# Patient Record
Sex: Male | Born: 1985 | Hispanic: Yes | Marital: Married | Smoking: Never smoker
Health system: Southern US, Community
[De-identification: ages and names within clinical notes are randomized; demographics above are authoritative.]

## PROBLEM LIST (undated history)

## (undated) DIAGNOSIS — I10 Essential (primary) hypertension: Secondary | ICD-10-CM

## (undated) HISTORY — PX: OTHER SURGICAL HISTORY: SHX169

---

## 2010-05-14 ENCOUNTER — Emergency Department (HOSPITAL_COMMUNITY): Admission: EM | Admit: 2010-05-14 | Discharge: 2010-05-15 | Payer: Self-pay | Admitting: Emergency Medicine

## 2010-12-24 LAB — URINE MICROSCOPIC-ADD ON

## 2010-12-24 LAB — URINALYSIS, ROUTINE W REFLEX MICROSCOPIC
Glucose, UA: NEGATIVE mg/dL
Leukocytes, UA: NEGATIVE
Nitrite: NEGATIVE
Specific Gravity, Urine: 1.031 — ABNORMAL HIGH (ref 1.005–1.030)
pH: 6 (ref 5.0–8.0)

## 2011-08-25 ENCOUNTER — Other Ambulatory Visit: Payer: Self-pay

## 2011-08-25 ENCOUNTER — Emergency Department (HOSPITAL_COMMUNITY)
Admission: EM | Admit: 2011-08-25 | Discharge: 2011-08-25 | Discharge status: Against Medical Advice | Payer: Self-pay | Attending: Emergency Medicine | Admitting: Emergency Medicine

## 2011-08-25 ENCOUNTER — Encounter: Payer: Self-pay | Admitting: Emergency Medicine

## 2011-08-25 DIAGNOSIS — R55 Syncope and collapse: Secondary | ICD-10-CM

## 2011-08-25 DIAGNOSIS — R064 Hyperventilation: Secondary | ICD-10-CM | POA: Insufficient documentation

## 2011-08-25 HISTORY — DX: Essential (primary) hypertension: I10

## 2011-08-25 LAB — CBC
Platelets: 151 10*3/uL (ref 150–400)
RBC: 4.8 MIL/uL (ref 4.22–5.81)
RDW: 12.5 % (ref 11.5–15.5)
WBC: 7.5 10*3/uL (ref 4.0–10.5)

## 2011-08-25 LAB — TROPONIN I: Troponin I: 0.6 ng/mL (ref <0.30)

## 2011-08-25 LAB — BASIC METABOLIC PANEL
CO2: 26 mEq/L (ref 19–32)
Chloride: 100 mEq/L (ref 96–112)
GFR calc Af Amer: >90 mL/min (ref >90)
Sodium: 138 mEq/L (ref 135–145)

## 2011-08-25 NOTE — ED Notes (Signed)
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 Than prior to having pacemaker placed

## 2011-08-25 NOTE — ED Notes (Signed)
Copy of ekg given to dr.

## 2011-08-25 NOTE — ED Notes (Signed)
Notified Dr Patria Mane that pt wants to leave AMA

## 2011-08-25 NOTE — ED Notes (Signed)
Pt/family express that they would like to leave d/t not being seen quickly; edp notified and will come to bedside in a few minutes, updated pt/family; pt/family still expresses that they would like to leave bc pt feels better and wants to go home

## 2011-08-25 NOTE — ED Notes (Signed)
Pt resting; family at bedside now; EKG done

## 2011-08-25 NOTE — ED Notes (Signed)
Patient left AMA; e-signature obtained.

## 2011-08-25 NOTE — ED Provider Notes (Signed)
The patient left AMA prior to my evaluation. I did not perform a hx or physical.  Lyanne Co, MD 08/25/11 [phone]

## 2011-08-25 NOTE — ED Notes (Signed)
2ndary assessment; pt reports that he was laying on the couch earlier today, and he was feeling fine, but then he went to get up to void, and when he was in the bathroom, he felt dizzy/lightheaded and felt his heart racing; pt reports that he started to have black and white vision; then he felt tingling in his bil arms; pt reports now that he is having pressure in his midsternum that radiates up to his throat and report burning in his throat similar to heartburn

## 2011-08-25 NOTE — ED Notes (Signed)
Delay explained to pt.  

## 2012-07-03 DIAGNOSIS — I10 Essential (primary) hypertension: Secondary | ICD-10-CM | POA: Insufficient documentation

## 2012-07-03 DIAGNOSIS — M7660 Achilles tendinitis, unspecified leg: Secondary | ICD-10-CM | POA: Insufficient documentation

## 2012-07-04 ENCOUNTER — Emergency Department (HOSPITAL_COMMUNITY)
Admission: EM | Admit: 2012-07-04 | Discharge: 2012-07-04 | Disposition: A | Discharge status: Home / Self Care | Payer: Self-pay | Attending: Emergency Medicine | Admitting: Emergency Medicine

## 2012-07-04 DIAGNOSIS — M7650 Patellar tendinitis, unspecified knee: Secondary | ICD-10-CM

## 2012-07-04 MED ORDER — DICLOFENAC SODIUM 75 MG PO TBEC: 75.0000 mg | DELAYED_RELEASE_TABLET | Freq: Two times a day (BID) | ORAL | Status: DC

## 2012-07-04 MED ORDER — OXYCODONE-ACETAMINOPHEN 5-325 MG PO TABS
1.0000 | ORAL_TABLET | Freq: Once | ORAL | Status: AC
  Administered 2012-07-04: 1 via ORAL
  Filled 2012-07-04: qty 1

## 2012-07-04 NOTE — ED Notes (Signed)
Pt c/o bilat leg pain. Pt works as Airline pilot and actively plays soccer.Vss.pwd

## 2012-07-04 NOTE — ED Notes (Signed)
Bilateral Leg pain x a few days. Pt c/o pain in right ankle but mostly its his left knee that is the major issue of concern. He says the pain from his knee radiates up to his thigh area. Pt also c/o pain in the left heel as well. Works as a Airline pilot. Has been managing his pain with motrin. Rates pain 8/10

## 2012-07-04 NOTE — ED Provider Notes (Signed)
History     CSN: [id number]  Arrival date & time 07/03/12  2256   First MD Initiated Contact with Patient 07/04/12 0128      Chief Complaint  Patient presents with  . 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 He says that it started and bilateral foot pain, but is now bilateral knees that are sore over the patellar region. He admits that he does not wear supportive shoes, nor stretch before playing soccer. The symptoms have been persistent for 2.5 weeks. He has not tried taking any medications. He denies systemic symptoms of N/V/F/D, weakness, chills. He is able to ambulate without limp. NAD/VSS   Past Medical History  Diagnosis Date  . Hypertension     Past Surgical History  Procedure Date  . Pacemaker insertion and removal     No family history on file.  History  Substance Use Topics  . Smoking status: Never Smoker   . Smokeless tobacco: Never Used  . Alcohol Use: Yes     occasionally      Review of Systems   Review of Systems  Gen: no weight loss, fevers, chills, night sweats  Eyes: no discharge or drainage, no occular pain or visual changes  Nose: no epistaxis or rhinorrhea  Mouth: no dental pain, no sore throat  Neck: no neck pain  Lungs:No wheezing, coughing or hemoptysis CV: no chest pain, palpitations, dependent edema or orthopnea  Abd: no abdominal pain, nausea, vomiting  GU: no dysuria or gross hematuria  MSK:  Bilateral knee pain  Neuro: no headache, no focal neurologic deficits  Skin: no abnormalities Psyche: negative.    Allergies  Hydrocodone  Home Medications   Current Outpatient Rx  Name Route Sig Dispense Refill  . AMLODIPINE BESYLATE 10 MG PO TABS Oral Take 10 mg by mouth daily.    . IBUPROFEN 200 MG PO TABS Oral Take 600 mg by mouth every 6 (six) hours as  needed.    Marland Kitchen DICLOFENAC SODIUM 75 MG PO TBEC Oral Take 1 tablet (75 mg total) by mouth 2 (two) times daily. 28 tablet 0    BP 117/68  Pulse 53  Temp 98.7 F (37.1 C) (Oral)  Resp 18  Ht 5\' 7"  (1.702 m)  Wt 155 lb (70.308 kg)  BMI 24.28 kg/m2  SpO2 100%  Physical Exam  Nursing note and vitals reviewed. Constitutional: He appears well-developed and well-nourished. No distress.  HENT:  Head: Normocephalic and atraumatic.  Eyes: Pupils are equal, round, and reactive to light.  Neck: Normal range of motion. Neck supple.  Cardiovascular: Normal rate and regular rhythm.   Pulmonary/Chest: Effort normal.  Musculoskeletal:       Right knee: He exhibits normal range of motion, no swelling, no effusion, no ecchymosis, no deformity, no laceration, no erythema, normal alignment, no LCL laxity, normal patellar mobility, no bony tenderness, normal meniscus and no MCL laxity. tenderness (bilateral petaller tenderness without erythema or induration.) found. Patellar tendon tenderness noted.  Neurological: He is alert.  Skin: Skin is warm and dry.    ED Course  Procedures (including critical care time)  Labs Reviewed - No data to display No results found.   1. Patellar tendinitis       MDM  i suspect over use injuries to joints. HE is young and healthy. NO real abnormality on physical exam aside from  tenderness to palpation of patellar tendons, which is mild.  Written him for Voltaren, i suggest he try Dr. Jari Sportsman show inserts for when he plans to be on his feet for prolonged period of time. Also, stretching and icing recommended.  Referral to Ortho given and PCP.  Pt has been advised of the symptoms that warrant their return to the ED. Patient has voiced understanding and has agreed to follow-up with the PCP or specialist.         Dorthula Matas, PA 07/04/12 [phone]

## 2012-07-05 NOTE — ED Provider Notes (Signed)
Medical screening examination/treatment/procedure(s) were performed by non-physician practitioner and as supervising physician I was immediately available for consultation/collaboration.  Jonathandavid Marlett, MD 07/05/12 0024 

## 2020-05-18 ENCOUNTER — Emergency Department: Payer: Self-pay

## 2020-05-18 ENCOUNTER — Other Ambulatory Visit: Payer: Self-pay

## 2020-05-18 ENCOUNTER — Encounter: Payer: Self-pay | Admitting: Emergency Medicine

## 2020-05-18 ENCOUNTER — Emergency Department
Admission: EM | Admit: 2020-05-18 | Discharge: 2020-05-18 | Disposition: A | Discharge status: Home / Self Care | Payer: Self-pay | Attending: Emergency Medicine | Admitting: Emergency Medicine

## 2020-05-18 DIAGNOSIS — Z20822 Contact with and (suspected) exposure to covid-19: Secondary | ICD-10-CM | POA: Insufficient documentation

## 2020-05-18 DIAGNOSIS — Z950 Presence of cardiac pacemaker: Secondary | ICD-10-CM | POA: Insufficient documentation

## 2020-05-18 DIAGNOSIS — I10 Essential (primary) hypertension: Secondary | ICD-10-CM | POA: Insufficient documentation

## 2020-05-18 DIAGNOSIS — R079 Chest pain, unspecified: Secondary | ICD-10-CM | POA: Insufficient documentation

## 2020-05-18 LAB — BASIC METABOLIC PANEL
Anion gap: 7 (ref 5–15)
BUN: 13 mg/dL (ref 6–20)
CO2: 29 mmol/L (ref 22–32)
Calcium: 8.9 mg/dL (ref 8.9–10.3)
Chloride: 104 mmol/L (ref 98–111)
Creatinine, Ser: 0.88 mg/dL (ref 0.61–1.24)
GFR calc Af Amer: >60 mL/min (ref >60)
GFR calc non Af Amer: >60 mL/min (ref >60)
Glucose, Bld: 112 mg/dL — ABNORMAL HIGH (ref 70–99)
Potassium: 4 mmol/L (ref 3.5–5.1)
Sodium: 140 mmol/L (ref 135–145)

## 2020-05-18 LAB — CBC
HCT: 41.7 % (ref 39.0–52.0)
Hemoglobin: 15 g/dL (ref 13.0–17.0)
MCH: 32.1 pg (ref 26.0–34.0)
MCHC: 36 g/dL (ref 30.0–36.0)
MCV: 89.1 fL (ref 80.0–100.0)
Platelets: 182 10*3/uL (ref 150–400)
RBC: 4.68 MIL/uL (ref 4.22–5.81)
RDW: 12.8 % (ref 11.5–15.5)
WBC: 3.7 10*3/uL — ABNORMAL LOW (ref 4.0–10.5)
nRBC: 0 % (ref 0.0–0.2)

## 2020-05-18 LAB — SARS CORONAVIRUS 2 BY RT PCR (HOSPITAL ORDER, PERFORMED IN ~~LOC~~ HOSPITAL LAB): SARS Coronavirus 2: NEGATIVE

## 2020-05-18 LAB — TROPONIN I (HIGH SENSITIVITY)
Troponin I (High Sensitivity): 7 ng/L (ref <18)
Troponin I (High Sensitivity): 5 ng/L (ref <18)

## 2020-05-18 MED ORDER — NITROGLYCERIN 0.4 MG SL SUBL
0.4000 mg | SUBLINGUAL_TABLET | SUBLINGUAL | Status: AC
  Administered 2020-05-18: 0.4 mg via SUBLINGUAL
  Filled 2020-05-18: qty 1

## 2020-05-18 MED ORDER — IOHEXOL 350 MG/ML SOLN
75.0000 mL | Freq: Once | INTRAVENOUS | Status: AC | PRN
  Administered 2020-05-18: 75 mL via INTRAVENOUS

## 2020-05-18 MED ORDER — ASPIRIN 81 MG PO CHEW
324.0000 mg | CHEWABLE_TABLET | Freq: Once | ORAL | Status: AC
  Administered 2020-05-18: 324 mg via ORAL
  Filled 2020-05-18: qty 4

## 2020-05-18 NOTE — ED Triage Notes (Addendum)
Patient presents to the ED with chest pain radiating down his left arm x 1 week.  Patient states today the chest pain feels worse.  Patient describes the pain as tightness, more on the left side.  Movement does not increase pain.  Patient also reports a pre-syncopal episode a couple of days ago.  Patient had a pacemaker in the past but states it was taken out approx. 6 years ago due to infection.

## 2020-05-18 NOTE — ED Provider Notes (Signed)
[facility] Emergency Department Provider Note   ____________________________________________   None    (approximate)  I have reviewed the triage vital signs and the nursing notes.   HISTORY  Chief Complaint Chest Pain    HPI A 34 year old patient with a history of hypertension presents for evaluation of chest pain. Initial onset of pain was more than 6 hours ago. The patient's chest pain is described as heaviness/pressure/tightness and is not worse with exertion. The patient reports some diaphoresis. The patient's chest pain is middle- or left-sided, is not well-localized, is not sharp and does radiate to the arms/jaw/neck. The patient does not complain of nausea. The patient has a family history of coronary artery disease in a first-degree relative with onset less than age 35. The patient has no history of stroke, has no history of peripheral artery disease, has not smoked in the past 90 days, denies any history of treated diabetes, has no history of hypercholesterolemia and does not have an elevated BMI (>=30).   Patient has been having chest pain for about 1 week.  Reports this is a recurring issue it is located in his left upper chest.  He is previously had a pacemaker but actually had it removed about 6 years ago by his cardiologist.  Not sure why.  She still has 1 wire left.  He has not had any skipped beats or palpitations.  No fevers or chills.  No cough.  No shortness of breath.  No leg swelling.  Pain he reports is mild to moderate located in the left upper chest and persistent for about a week now.   Past Medical History:  Diagnosis Date   Hypertension     There are no problems to display for this patient.   Past Surgical History:  Procedure Laterality Date   pacemaker insertion and removal      Prior to Admission medications   Medication Sig Start Date End Date Taking? Authorizing Provider  ibuprofen (ADVIL,MOTRIN) 200 MG tablet Take  600 mg by mouth every 6 (six) hours as needed.   Yes [provider]    Allergies Hydrocodone  No family history on file.  Social History Social History   Tobacco Use   Smoking status: Never Smoker   Smokeless tobacco: Never Used  Substance Use Topics   Alcohol use: Yes    Comment: occasionally   Drug use: No    Review of Systems Constitutional: No fever/chills Eyes: No visual changes. ENT: No sore throat. Cardiovascular: See HPI  respiratory: Denies shortness of breath. Gastrointestinal: No abdominal pain.   Genitourinary: Negative for dysuria. Musculoskeletal: Negative for back pain. Skin: Negative for rash. Neurological: Negative for headaches, areas of focal weakness or numbness.    ____________________________________________   PHYSICAL EXAM:  VITAL SIGNS: ED Triage Vitals  Enc Vitals Group     BP 05/18/20 0905 116/73     Pulse Rate 05/18/20 0905 63     Resp 05/18/20 0905 17     Temp 05/18/20 0905 98 F (36.7 C)     Temp Source 05/18/20 0905 Oral     SpO2 05/18/20 0905 100 %     Weight 05/18/20 0920 165 lb (74.8 kg)     Height 05/18/20 0920 5\' 5"  (1.651 m)     Head Circumference --      Peak Flow --      Pain Score --      Pain Loc --      Pain Edu? --  Excl. in GC? --     Constitutional: Alert and oriented. Well appearing and in no acute distress. Eyes: Conjunctivae are normal. Head: Atraumatic. Nose: No congestion/rhinnorhea. Mouth/Throat: Mucous membranes are moist. Neck: No stridor.  Cardiovascular: Normal rate, regular rhythm. Grossly normal heart sounds.  Good peripheral circulation.  Patient has palpable lead below the subcutaneous tissue left upper chest, there is no drainage redness or surrounding erythema but he does report the area to have a tender discomfort to it.  He also tells me this is something has happened him several times in the past seems to go might come and go at times not really sure why but he experienced  the symptoms several times Respiratory: Normal respiratory effort.  No retractions. Lungs CTAB. Gastrointestinal: Soft and nontender. No distention. Musculoskeletal: No lower extremity tenderness nor edema. Neurologic:  Normal speech and language. No gross focal neurologic deficits are appreciated.  Skin:  Skin is warm, dry and intact. No rash noted. Psychiatric: Mood and affect are normal. Speech and behavior are normal.  ____________________________________________   LABS (all labs ordered are listed, but only abnormal results are displayed)  Labs Reviewed  BASIC METABOLIC PANEL - Abnormal; Notable for the following components:      Result Value   Glucose, Bld 112 (*)    All other components within normal limits  CBC - Abnormal; Notable for the following components:   WBC 3.7 (*)    All other components within normal limits  SARS CORONAVIRUS 2 BY RT PCR (HOSPITAL ORDER, PERFORMED IN [facility])  TROPONIN I (HIGH SENSITIVITY)  TROPONIN I (HIGH SENSITIVITY)   ____________________________________________  EKG  Triage EKG reviewed by me at 6 PM, Initial EKG performed at 9:10 AM Heart rate 55 QRS 100 QTc 400 Sinus bradycardia, incomplete right bundle branch block.  Notable ST abnormality including ST elevation in V2 and biphasic appearance V3 as well as J-point type elevation in V4.  Multiple T wave abnormalities noted.  Is may be indicative of early repolarization abnormality  Compared with previous EKG from October 18, 2011, appears similar in morphology with regard to V2, but V3 demonstrates biphasic appearance now ____________________________________________  RADIOLOGY  No results found.   Imaging review studies reviewed, negative for acute findings in the chest ____________________________________________   PROCEDURES  Procedure(s) performed: None  Procedures  Critical Care performed: No  ____________________________________________   INITIAL  IMPRESSION / ASSESSMENT AND PLAN / ED COURSE  Pertinent labs & imaging results that were available during my care of the patient were reviewed by me and considered in my medical decision making (see chart for details).   Differential diagnosis includes, but is not limited to, ACS, aortic dissection, pulmonary embolism, cardiac tamponade, pneumothorax, pneumonia, pericarditis, myocarditis, GI-related causes including esophagitis/gastritis, and musculoskeletal chest wall pain.     Clinical Course as of May 20 1340  Mon May 18, 2020  2152 Case, history, EKGs all reviewed with Dr. Irena Cords of cardiology.    [MQ]    Clinical Course User Index [MQ] Sharyn Creamer, MD   HEAR Score: 4    ----------------------------------------- 10:11 PM on 05/18/2020 -----------------------------------------  Patient is doing well resting comfortably.  Denies any ongoing symptoms except for light discomfort in the left upper chest that seem that has been experiencing for about a week now.  His work-up very reassuring I discussed his EKGs and viewed them with [facility] who advises close outpatient follow-up, discussed with the both the patient and his family at the  bedside all in agreement.  Royalty Domagala was evaluated in Emergency Department on 05/20/2020 for the symptoms described in the history of present illness. He was evaluated in the context of the global COVID-19 pandemic, which necessitated consideration that the patient might be at risk for infection with the SARS-CoV-2 virus that causes COVID-19. Institutional protocols and algorithms that pertain to the evaluation of patients at risk for COVID-19 are in a state of rapid change based on information released by regulatory bodies including the CDC and federal and state organizations. These policies and algorithms were followed during the patient's care in the ED.  negative Covid  ____________________________________________   FINAL CLINICAL  IMPRESSION(S) / ED DIAGNOSES  Final diagnoses:  Chest pain, moderate coronary artery risk        Note:  This document was prepared using Dragon voice recognition software and may include unintentional dictation errors       Sharyn Creamer, MD 05/20/20 1341

## 2020-05-18 NOTE — Discharge Instructions (Signed)

## 2020-06-03 ENCOUNTER — Observation Stay
Admission: EM | Admit: 2020-06-03 | Discharge: 2020-06-04 | Disposition: A | Discharge status: Home / Self Care | Payer: Self-pay | Attending: Internal Medicine | Admitting: Internal Medicine

## 2020-06-03 ENCOUNTER — Encounter: Payer: Self-pay | Admitting: Emergency Medicine

## 2020-06-03 ENCOUNTER — Emergency Department: Payer: Self-pay

## 2020-06-03 ENCOUNTER — Other Ambulatory Visit: Payer: Self-pay

## 2020-06-03 ENCOUNTER — Encounter
Admission: EM | Disposition: A | Discharge status: Home / Self Care | Payer: Self-pay | Source: Home / Self Care | Attending: Emergency Medicine

## 2020-06-03 DIAGNOSIS — I249 Acute ischemic heart disease, unspecified: Principal | ICD-10-CM | POA: Insufficient documentation

## 2020-06-03 DIAGNOSIS — R739 Hyperglycemia, unspecified: Secondary | ICD-10-CM | POA: Insufficient documentation

## 2020-06-03 DIAGNOSIS — R0789 Other chest pain: Secondary | ICD-10-CM | POA: Diagnosis present

## 2020-06-03 DIAGNOSIS — R079 Chest pain, unspecified: Secondary | ICD-10-CM

## 2020-06-03 DIAGNOSIS — Z20822 Contact with and (suspected) exposure to covid-19: Secondary | ICD-10-CM | POA: Insufficient documentation

## 2020-06-03 DIAGNOSIS — I10 Essential (primary) hypertension: Secondary | ICD-10-CM | POA: Insufficient documentation

## 2020-06-03 LAB — CBC WITH DIFFERENTIAL/PLATELET
Abs Immature Granulocytes: 0.01 10*3/uL (ref 0.00–0.07)
Basophils Absolute: 0 10*3/uL (ref 0.0–0.1)
Basophils Relative: 1 %
Eosinophils Absolute: 0.1 10*3/uL (ref 0.0–0.5)
Eosinophils Relative: 2 %
HCT: 40.6 % (ref 39.0–52.0)
Hemoglobin: 14.2 g/dL (ref 13.0–17.0)
Immature Granulocytes: 0 %
Lymphocytes Relative: 31 %
Lymphs Abs: 1.4 10*3/uL (ref 0.7–4.0)
MCH: 31.8 pg (ref 26.0–34.0)
MCHC: 35 g/dL (ref 30.0–36.0)
MCV: 91 fL (ref 80.0–100.0)
Monocytes Absolute: 0.3 10*3/uL (ref 0.1–1.0)
Monocytes Relative: 7 %
Neutro Abs: 2.6 10*3/uL (ref 1.7–7.7)
Neutrophils Relative %: 59 %
Platelets: 172 10*3/uL (ref 150–400)
RBC: 4.46 MIL/uL (ref 4.22–5.81)
RDW: 12.7 % (ref 11.5–15.5)
WBC: 4.4 10*3/uL (ref 4.0–10.5)
nRBC: 0 % (ref 0.0–0.2)

## 2020-06-03 LAB — APTT: aPTT: 32 seconds (ref 24–36)

## 2020-06-03 LAB — PROTIME-INR
INR: 1.1 (ref 0.8–1.2)
Prothrombin Time: 13.3 seconds (ref 11.4–15.2)

## 2020-06-03 LAB — COMPREHENSIVE METABOLIC PANEL
ALT: 20 U/L (ref 0–44)
AST: 27 U/L (ref 15–41)
Albumin: 4.3 g/dL (ref 3.5–5.0)
Alkaline Phosphatase: 80 U/L (ref 38–126)
Anion gap: 10 (ref 5–15)
BUN: 21 mg/dL — ABNORMAL HIGH (ref 6–20)
CO2: 25 mmol/L (ref 22–32)
Calcium: 8.7 mg/dL — ABNORMAL LOW (ref 8.9–10.3)
Chloride: 102 mmol/L (ref 98–111)
Creatinine, Ser: 0.89 mg/dL (ref 0.61–1.24)
GFR calc Af Amer: >60 mL/min (ref >60)
GFR calc non Af Amer: >60 mL/min (ref >60)
Glucose, Bld: 144 mg/dL — ABNORMAL HIGH (ref 70–99)
Potassium: 3.7 mmol/L (ref 3.5–5.1)
Sodium: 137 mmol/L (ref 135–145)
Total Bilirubin: 1 mg/dL (ref 0.3–1.2)
Total Protein: 7.2 g/dL (ref 6.5–8.1)

## 2020-06-03 LAB — LIPID PANEL
Cholesterol: 174 mg/dL (ref 0–200)
HDL: 48 mg/dL (ref >40)
LDL Cholesterol: 103 mg/dL — ABNORMAL HIGH (ref 0–99)
Total CHOL/HDL Ratio: 3.6 RATIO
Triglycerides: 115 mg/dL (ref <150)
VLDL: 23 mg/dL (ref 0–40)

## 2020-06-03 LAB — TROPONIN I (HIGH SENSITIVITY)
Troponin I (High Sensitivity): 5 ng/L (ref <18)
Troponin I (High Sensitivity): 3 ng/L (ref <18)

## 2020-06-03 LAB — SARS CORONAVIRUS 2 BY RT PCR (HOSPITAL ORDER, PERFORMED IN ~~LOC~~ HOSPITAL LAB): SARS Coronavirus 2: NEGATIVE

## 2020-06-03 SURGERY — CORONARY/GRAFT ACUTE MI REVASCULARIZATION
Anesthesia: Moderate Sedation

## 2020-06-03 MED ORDER — SODIUM CHLORIDE 0.9 % IV SOLN: INTRAVENOUS | Status: DC

## 2020-06-03 MED ORDER — HEPARIN BOLUS VIA INFUSION
4000.0000 [IU] | Freq: Once | INTRAVENOUS | Status: DC
  Filled 2020-06-03: qty 4000

## 2020-06-03 MED ORDER — ATORVASTATIN CALCIUM 80 MG PO TABS
80.0000 mg | ORAL_TABLET | Freq: Every day | ORAL | Status: DC
  Administered 2020-06-03: 80 mg via ORAL
  Filled 2020-06-03: qty 4

## 2020-06-03 MED ORDER — HEPARIN SODIUM (PORCINE) 5000 UNIT/ML IJ SOLN
4000.0000 [IU] | Freq: Once | INTRAMUSCULAR | Status: AC
  Administered 2020-06-03: 4000 [IU] via INTRAVENOUS

## 2020-06-03 MED ORDER — FENTANYL CITRATE (PF) 100 MCG/2ML IJ SOLN
50.0000 ug | Freq: Once | INTRAMUSCULAR | Status: AC
  Administered 2020-06-03: 50 ug via INTRAVENOUS
  Filled 2020-06-03: qty 2

## 2020-06-03 MED ORDER — ASPIRIN EC 325 MG PO TBEC: 325.0000 mg | DELAYED_RELEASE_TABLET | Freq: Every day | ORAL | Status: DC

## 2020-06-03 MED ORDER — ASPIRIN 81 MG PO CHEW
324.0000 mg | CHEWABLE_TABLET | Freq: Once | ORAL | Status: AC
  Administered 2020-06-03: 324 mg via ORAL

## 2020-06-03 MED ORDER — ALPRAZOLAM 0.25 MG PO TABS: 0.2500 mg | ORAL_TABLET | Freq: Two times a day (BID) | ORAL | Status: DC | PRN

## 2020-06-03 MED ORDER — HEPARIN (PORCINE) 25000 UT/250ML-% IV SOLN: 850.0000 [IU]/h | INTRAVENOUS | Status: DC

## 2020-06-03 MED ORDER — ACETAMINOPHEN 325 MG PO TABS: 650.0000 mg | ORAL_TABLET | ORAL | Status: DC | PRN

## 2020-06-03 MED ORDER — ONDANSETRON HCL 4 MG/2ML IJ SOLN: 4.0000 mg | Freq: Four times a day (QID) | INTRAMUSCULAR | Status: DC | PRN

## 2020-06-03 MED ORDER — ZOLPIDEM TARTRATE 5 MG PO TABS: 5.0000 mg | ORAL_TABLET | Freq: Every evening | ORAL | Status: DC | PRN

## 2020-06-03 NOTE — ED Notes (Signed)
Covid swab sent to lab.

## 2020-06-03 NOTE — ED Notes (Signed)
324mg  aspirin chewable given at this time

## 2020-06-03 NOTE — ED Notes (Signed)
Dr. Cassie Freer at bedside at this time

## 2020-06-03 NOTE — Consult Note (Signed)
ANTICOAGULATION CONSULT NOTE - Initial Consult  Pharmacy Consult for Heparin Indication: chest pain/ACS  Allergies  Allergen Reactions  . Hydrocodone Swelling    Swelling to face    Patient Measurements: Height: 5\' 5"  (165.1 cm) Weight: 74.8 kg (165 lb) IBW/kg (Calculated) : 61.5 Heparin Dosing Weight: 74.8 kg  Vital Signs: Temp: 98.1 F (36.7 C) (08/25 1742) Temp Source: Oral (08/25 1742) BP: 105/74 (08/25 1753) Pulse Rate: 68 (08/25 1753)  Labs: Recent Labs    06/03/20 1747  HGB 14.2  HCT 40.6  PLT 172  APTT 32  LABPROT 13.3  INR 1.1    Estimated Creatinine Clearance: 109.6 mL/min (by C-G formula based on SCr of 0.88 mg/dL).   Medical History: Past Medical History:  Diagnosis Date  . Hypertension     Medications:  (Not in a hospital admission)  Scheduled:   Infusions:  . sodium chloride     PRN:  Anti-infectives (From admission, onward)   None      Assessment: Pharmacy consulted to start heparin for ACS. No DOAC PTA noted.   Goal of Therapy:  Heparin level 0.3-0.7 units/ml Monitor platelets by anticoagulation protocol: Yes   Plan:  Give 4000 units bolus x 1 Start heparin infusion at 850 units/hr Check anti-Xa level in 6 hours and daily while on heparin Continue to monitor H&H and platelets  06/05/20, PharmD, BCPS 06/03/2020,6:08 PM

## 2020-06-03 NOTE — ED Notes (Signed)
Dr Goodman at bedside with pt. 

## 2020-06-03 NOTE — ED Notes (Signed)
Repeat EKG done at this time.

## 2020-06-03 NOTE — ED Provider Notes (Signed)
[facility] Emergency Department Provider Note  ____________________________________________   I have reviewed the triage vital signs and the nursing notes.   HISTORY  Chief Complaint Chest Pain   History limited by: Not Limited   HPI Ricky Green is a 34 y.o. male who presents to the emergency department today because of concern for chest pain. Located in his left chest it started this morning. The patient says that it feels like a pressure. Is accompanied by shortness of breath and some sweating. Patient had similar pain a couple of weeks ago and says he was evaluated in the emergency department. The patient denies any fevers. Does have history of syncope in the past and had a pacemaker placed roughly 5-6 years ago. The patient had it removed because of concern for infection.    Records reviewed. Per medical record review patient has a history of HTN.  Past Medical History:  Diagnosis Date  . Hypertension     There are no problems to display for this patient.   Past Surgical History:  Procedure Laterality Date  . pacemaker insertion and removal      Prior to Admission medications   Medication Sig Start Date End Date Taking? Authorizing Provider  ibuprofen (ADVIL,MOTRIN) 200 MG tablet Take 600 mg by mouth every 6 (six) hours as needed.    [provider]    Allergies Hydrocodone  History reviewed. No pertinent family history.  Social History Social History   Tobacco Use  . Smoking status: Never Smoker  . Smokeless tobacco: Never Used  Substance Use Topics  . Alcohol use: Yes    Comment: occasionally  . Drug use: No    Review of Systems Constitutional: No fever/chills Eyes: No visual changes. ENT: No sore throat. Cardiovascular: Positive for chest pain. Respiratory: Positive for shortness of breath. Gastrointestinal: No abdominal pain.  No nausea, no vomiting.  No diarrhea.   Genitourinary: Negative for  dysuria. Musculoskeletal: Negative for back pain. Skin: Negative for rash. Neurological: Negative for headaches, focal weakness or numbness.  ____________________________________________   PHYSICAL EXAM:  VITAL SIGNS: ED Triage Vitals [06/03/20 1742]  Enc Vitals Group     BP 120/64     Pulse Rate 65     Resp 20     Temp 98.1 F (36.7 C)     Temp Source Oral     SpO2 99 %     Weight 165 lb (74.8 kg)     Height 5\' 5"  (1.651 m)     Head Circumference      Peak Flow      Pain Score 7   Constitutional: Alert and oriented.  Eyes: Conjunctivae are normal.  ENT      Head: Normocephalic and atraumatic.      Nose: No congestion/rhinnorhea.      Mouth/Throat: Mucous membranes are moist.      Neck: No stridor. Hematological/Lymphatic/Immunilogical: No cervical lymphadenopathy. Cardiovascular: Normal rate, regular rhythm.  No murmurs, rubs, or gallops.  Respiratory: Normal respiratory effort without tachypnea nor retractions. Breath sounds are clear and equal bilaterally. No wheezes/rales/rhonchi. Gastrointestinal: Soft and non tender. No rebound. No guarding.  Genitourinary: Deferred Musculoskeletal: Normal range of motion in all extremities. No lower extremity edema. Neurologic:  Normal speech and language. No gross focal neurologic deficits are appreciated.  Skin:  Skin is warm, dry and intact. No rash noted. Psychiatric: Mood and affect are normal. Speech and behavior are normal. Patient exhibits appropriate insight and judgment.  ____________________________________________  LABS (pertinent positives/negatives)  Trop hs 5 CMP wnl except glu 144, BUN 21, ca 8.8 CBC wbc 4.4, hgb 14.2, plt 172 ____________________________________________   EKG  I, Phineas Semen, attending physician, personally viewed and interpreted this EKG  EKG Time: 1729 Rate: 67 Rhythm: normal sinus rhythm Axis: normal Intervals: qtc 424 QRS: narrow, q waves II, III, aVF ST changes: st  elevation 1 avl, v2, st depression II, III, aVF, V6 Impression: abnormal ekg   ____________________________________________    RADIOLOGY  CXR No acute abnormality  ____________________________________________   PROCEDURES  Procedures  ____________________________________________   INITIAL IMPRESSION / ASSESSMENT AND PLAN / ED COURSE  Pertinent labs & imaging results that were available during my care of the patient were reviewed by me and considered in my medical decision making (see chart for details).   Patient presented to the emergency department today because of concerns for chest pain.  EKG was concerning for possible ST elevation as well as some ST depressions in new appearing Q waves.  Because of this code STEMI was called.  Dr. Darrold Junker with cardiology came and evaluated the patient and evaluated EKGs.  At this time he did not think EKGs represented ST elevation MI.  Code STEMI was canceled.  Did however recommend admission for stress test tomorrow and serial troponins.   ____________________________________________   FINAL CLINICAL IMPRESSION(S) / ED DIAGNOSES  Final diagnoses:  Chest pain, unspecified type     Note: This dictation was prepared with Dragon dictation. Any transcriptional errors that result from this process are unintentional     Phineas Semen, MD 06/03/20 1904

## 2020-06-03 NOTE — H&P (Signed)
[place]   PATIENT NAME: Ricky Green    MR#:  [id number]  DATE OF BIRTH:  12/22/1985  DATE OF ADMISSION:  06/03/2020  PRIMARY CARE PHYSICIAN: Patient, No Pcp Per   REQUESTING/REFERRING PHYSICIAN: Phineas Semen, MD CHIEF COMPLAINT:   Chief Complaint  Patient presents with  . Chest Pain    HISTORY OF PRESENT ILLNESS:  Ricky Green  is a 34 y.o. male with a known history of hypertension, who presented to the emergency room with acute onset of midsternal chest pain felt as a pressure and sharp pain with associated diaphoresis, dyspnea and palpitations are started this morning and has been intermittent since then.  He denied any nausea or vomiting.  No leg pain or edema recent travels or surgeries.  It has been radiating to his left arm.  He was seen on 05/18/2020 in the ER and had a couple sets of negative troponins and plans were made for cardiac stress test and event monitor.  He denied any abdominal pain or melena or rectal bleeding per rectum.  No bleeding diathesis.  Upon presentation to the emergency room, heart rate was 69 and later 56 and otherwise vital signs were within normal.  Labs revealed unremarkable CMP and high-sensitivity troponin I of 5 mL 3.  CBC was unremarkable.  Lipid profile showed an LDL of 115 with HDL of 48 total cholesterol 174 with triglyceride of 115.  COVID-19 PCR came back negative.  Portable chest ray showed no acute cardiopulmonary disease.   EKG showed normal sinus rhythm with a rate of 66 with 1 mm ST segment elevation in V2 possibly related to early repolarization and T wave inversion in V1 and V3 with flattened T waves in V5 and V6 and inferior T wave inversion with ST segment depression.  Contact was made with Dr. Darrold Junker who is aware about the patient.  The patient was given 40 aspirin, 50 mcg of IV fentanyl and IV heparin.  He will be admitted to a progressive unit bed for further evaluation and management of suspected acute coronary  syndrome. PAST MEDICAL HISTORY:   Past Medical History:  Diagnosis Date  . Hypertension     PAST SURGICAL HISTORY:   Past Surgical History:  Procedure Laterality Date  . pacemaker insertion and removal      SOCIAL HISTORY:   Social History   Tobacco Use  . Smoking status: Never Smoker  . Smokeless tobacco: Never Used  Substance Use Topics  . Alcohol use: Yes    Comment: occasionally    FAMILY HISTORY:  Positive for coronary artery disease in his grandmother.  DRUG ALLERGIES:   Allergies  Allergen Reactions  . Hydrocodone Swelling    Swelling to face    REVIEW OF SYSTEMS:   ROS As per history of present illness. All pertinent systems were reviewed above. Constitutional, HEENT, cardiovascular, respiratory, GI, GU, musculoskeletal, neuro, psychiatric, endocrine, integumentary and hematologic systems were reviewed and are otherwise negative/unremarkable except for positive findings mentioned above in the HPI.   MEDICATIONS AT HOME:   Prior to Admission medications   Medication Sig Start Date End Date Taking? Authorizing Provider  aspirin 81 MG EC tablet Take by mouth.   Yes [provider]  ibuprofen (ADVIL,MOTRIN) 200 MG tablet Take 400 mg by mouth every 6 (six) hours as needed.    Yes [provider]      VITAL SIGNS:  Blood pressure 105/74, pulse 68, temperature 98.1 F (36.7 C), temperature source  Oral, resp. rate 18, height 5\' 5"  (1.651 m), weight 74.8 kg, SpO2 100 %.  PHYSICAL EXAMINATION:  Physical Exam  GENERAL:  34 y.o.-year-old  male patient lying in the bed with no acute distress.  EYES: Pupils equal, round, reactive to light and accommodation. No scleral icterus. Extraocular muscles intact.  HEENT: Head atraumatic, normocephalic. Oropharynx and nasopharynx clear.  NECK:  Supple, no jugular venous distention. No thyroid enlargement, no tenderness.  LUNGS: Normal breath sounds bilaterally, no wheezing, rales,rhonchi or  crepitation. No use of accessory muscles of respiration.  CARDIOVASCULAR: Regular rate and rhythm, S1, S2 normal. No murmurs, rubs, or gallops.  ABDOMEN: Soft, nondistended, nontender. Bowel sounds present. No organomegaly or mass.  EXTREMITIES: No pedal edema, cyanosis, or clubbing.  NEUROLOGIC: Cranial nerves II through XII are intact. Muscle strength 5/5 in all extremities. Sensation intact. Gait not checked.  PSYCHIATRIC: The patient is alert and oriented x 3.  Normal affect and good eye contact. SKIN: No obvious rash, lesion, or ulcer.   LABORATORY PANEL:   CBC Recent Labs  Lab 06/03/20 1747  WBC 4.4  HGB 14.2  HCT 40.6  PLT 172   ------------------------------------------------------------------------------------------------------------------  Chemistries  Recent Labs  Lab 06/03/20 1747  NA 137  K 3.7  CL 102  CO2 25  GLUCOSE 144*  BUN 21*  CREATININE 0.89  CALCIUM 8.7*  AST 27  ALT 20  ALKPHOS 80  BILITOT 1.0   ------------------------------------------------------------------------------------------------------------------  Cardiac Enzymes No results for input(s): TROPONINI in the last 168 hours. ------------------------------------------------------------------------------------------------------------------  RADIOLOGY:  DG Chest Portable 1 View  Result Date: 06/03/2020 CLINICAL DATA:  Left-sided chest pain for several hours EXAM: PORTABLE CHEST 1 VIEW COMPARISON:  05/18/2020 FINDINGS: Cardiac shadow is within normal limits. The lungs are well aerated bilaterally. Old pacing lead is noted over the left chest stable from the prior study. No focal infiltrate is seen. No bony abnormality is noted. IMPRESSION: No active disease. Electronically Signed   By: 07/18/2020 M.D.   On: 06/03/2020 18:38      IMPRESSION AND PLAN:   1.  Chest pain with abnormal EKG concerning for acute coronary syndrome. -The patient will be admitted to a progressive unit bed. -We  will continue him on IV heparin. -She will be continued on aspirin and will add high-dose statin therapy. -The patient will be placed on as needed sublingual nitroglycerin and morphine sulfate for pain. -Cardiology consultation will be obtained in a.m. for further cardiac risk stratification. -Dr. 06/05/2020 was notified about the patient and may be planning on cardiac catheterization.  2.  Essential hypertension. -We will continue his amlodipine.  3.  DVT prophylaxis. -The patient will be on IV heparin.   All the records are reviewed and case discussed with ED provider. The plan of care was discussed in details with the patient (and family). I answered all questions. The patient agreed to proceed with the above mentioned plan. Further management will depend upon hospital course.   CODE STATUS: Full code  Status is: Inpatient  Remains inpatient appropriate because:Ongoing diagnostic testing needed not appropriate for outpatient work up, Unsafe d/c plan, IV treatments appropriate due to intensity of illness or inability to take PO and Inpatient level of care appropriate due to severity of illness   Dispo: The patient is from: Home              Anticipated d/c is to: Home              Anticipated d/c  date is: 2 days              Patient currently is not medically stable to d/c.   TOTAL TIME TAKING CARE OF THIS PATIENT: 55 minutes.    Hannah Beat M.D on 06/03/2020 at 7:31 PM  [facility]   From 7 PM-7 AM, contact night-coverage [email or web address]  CC: Primary care physician; Patient, No Pcp Per   Note: This dictation was prepared with Dragon dictation along with smaller phrase technology. Any transcriptional typo errors that result from this process are unintentional.

## 2020-06-03 NOTE — Consult Note (Signed)
[facility] Cardiology  CARDIOLOGY CONSULT NOTE  Patient ID: Ricky Green MRN: [id number] DOB/AGE: [birth year] 36 y.o.  Admit date: 06/03/2020 Referring Physician Derrill Kay Primary Physician Primary Cardiologist Gwen Pounds Reason for Consultation chest pain  HPI: 34 year old gentleman referred for evaluation of chest pain and possible STEMI.  The patient awoke this morning at approximately 5 AM, experienced chest pain on and off all day, finally had a ride and came to the emergency room this afternoon at approximately 5 PM.  ECG revealed sinus rhythm, 1 mm of ST elevation in aVL with T wave inversion in lead III, V3 and V4.  The patient was recently seen in [facility] ED on 06/18/2020 with similar chest pain syndrome.  At that time ECG revealed incomplete right bundle branch block, with ST elevation in lead V3 and V4 and T wave inversions in leads III and aVF.  The patient was seen as outpatient by Dr. Gwen Pounds, 7 day Holter monitor was placed, with plan for stress test in 2 weeks.  The patient describes left-sided chest discomfort, pressure-like in sensation with shooting pains to his left arm.  Initial high sensitivity troponin is 5.  Review of systems complete and found to be negative unless listed above     Past Medical History:  Diagnosis Date  . Hypertension     Past Surgical History:  Procedure Laterality Date  . pacemaker insertion and removal      (Not in a hospital admission)  Social History   Socioeconomic History  . Marital status: Married    Spouse name: Not on file  . Number of children: Not on file  . Years of education: Not on file  . Highest education level: Not on file  Occupational History  . Not on file  Tobacco Use  . Smoking status: Never Smoker  . Smokeless tobacco: Never Used  Substance and Sexual Activity  . Alcohol use: Yes    Comment: occasionally  . Drug use: No  . Sexual activity: Not on file  Other Topics Concern  . Not on file  Social History Narrative  . Not  on file   Social Determinants of Health   Financial Resource Strain:   . Difficulty of Paying Living Expenses: Not on file  Food Insecurity:   . Worried About Programme researcher, broadcasting/film/video in the Last Year: Not on file  . Ran Out of Food in the Last Year: Not on file  Transportation Needs:   . Lack of Transportation (Medical): Not on file  . Lack of Transportation (Non-Medical): Not on file  Physical Activity:   . Days of Exercise per Week: Not on file  . Minutes of Exercise per Session: Not on file  Stress:   . Feeling of Stress : Not on file  Social Connections:   . Frequency of Communication with Friends and Family: Not on file  . Frequency of Social Gatherings with Friends and Family: Not on file  . Attends Religious Services: Not on file  . Active Member of Clubs or Organizations: Not on file  . Attends Banker Meetings: Not on file  . Marital Status: Not on file  Intimate Partner Violence:   . Fear of Current or Ex-Partner: Not on file  . Emotionally Abused: Not on file  . Physically Abused: Not on file  . Sexually Abused: Not on file    History reviewed. No pertinent family history.    Review of systems complete and found to be negative unless listed above  PHYSICAL EXAM  General: Well developed, well nourished, in no acute distress HEENT:  Normocephalic and atramatic Neck:  No JVD.  Lungs: Clear bilaterally to auscultation and percussion. Heart: HRRR . Normal S1 and S2 without gallops or murmurs.  Abdomen: Bowel sounds are positive, abdomen soft and non-tender  Msk:  Back normal, normal gait. Normal strength and tone for age. Extremities: No clubbing, cyanosis or edema.   Neuro: Alert and oriented X 3. Psych:  Good affect, responds appropriately  Labs:   Lab Results  Component Value Date   WBC 4.4 06/03/2020   HGB 14.2 06/03/2020   HCT 40.6 06/03/2020   MCV 91.0 06/03/2020   PLT 172 06/03/2020    Recent Labs  Lab 06/03/20 1747  NA 137  K  3.7  CL 102  CO2 25  BUN 21*  CREATININE 0.89  CALCIUM 8.7*  PROT 7.2  BILITOT 1.0  ALKPHOS 80  ALT 20  AST 27  GLUCOSE 144*   Lab Results  Component Value Date   TROPONINI 0.60 (HH) 08/25/2011    Lab Results  Component Value Date   CHOL 174 06/03/2020   Lab Results  Component Value Date   HDL 48 06/03/2020   Lab Results  Component Value Date   LDLCALC 103 (H) 06/03/2020   Lab Results  Component Value Date   TRIG 115 06/03/2020   Lab Results  Component Value Date   CHOLHDL 3.6 06/03/2020   No results found for: LDLDIRECT    Radiology: DG Chest 2 View  Result Date: 05/18/2020 CLINICAL DATA:  Chest pain EXAM: CHEST - 2 VIEW COMPARISON:  None. FINDINGS: Lungs are clear. Heart size and pulmonary vascularity are normal. 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 No pneumothorax. No adenopathy. No bone lesions. IMPRESSION: Lungs clear. Heart size normal. 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 No pneumothorax. Electronically Signed   By: Bretta Bang III M.D.   On: 05/18/2020 09:39   CT Angio Chest PE W and/or Wo Contrast  Result Date: 05/18/2020 CLINICAL DATA:  34 year old male with concern for pulmonary embolism. EXAM: CT ANGIOGRAPHY CHEST WITH CONTRAST TECHNIQUE: Multidetector CT imaging of the chest was performed using the standard protocol during bolus administration of intravenous contrast. Multiplanar CT image reconstructions and MIPs were obtained to evaluate the vascular anatomy. CONTRAST:  31mL OMNIPAQUE IOHEXOL 350 MG/ML SOLN COMPARISON:  Chest radiograph dated 05/18/2020. FINDINGS: Cardiovascular: There is no cardiomegaly or pericardial effusion. The thoracic aorta is unremarkable. Evaluation of the pulmonary arteries is limited due to suboptimal opacification as well as secondary to respiratory motion artifact. No pulmonary artery embolus identified. Mediastinum/Nodes: There is no hilar  or mediastinal adenopathy. The esophagus is grossly unremarkable. No mediastinal fluid collection. Lungs/Pleura: The lungs are clear. There is no pleural effusion pneumothorax. The central airways are patent. Upper Abdomen: No acute abnormality. Musculoskeletal: No chest wall abnormality. No acute or significant osseous findings. Review of the MIP images confirms the above findings. IMPRESSION: No acute intrathoracic pathology. No CT evidence of pulmonary embolism. Electronically Signed   By: Elgie Collard M.D.   On: 05/18/2020 19:42    EKG: Sinus rhythm at 66 bpm with 1 mm ST elevation in lead aVL with T wave inversion in lead III, V3 and V4  ASSESSMENT AND PLAN:   1.  Recurrent chest pain, nondiagnostic ECG for acute ST elevation myocardial infarction, late presentation, duration of chest pain 12 hours,  with normal high-sensitivity troponin 2.  Essential hypertension, blood pressure within normal range  Recommendations  1.  Cancel code STEMI 2.  Defer heparin drip at this time 3.  Continue to cycle cardiac enzymes 4.  Admit for observation 5.  If second troponin remains negative, proceed with Lexiscan Myoview in a.m.  Signed: Marcina Millard MD,PhD, [facility] 06/03/2020, 6:24 PM

## 2020-06-03 NOTE — ED Notes (Signed)
Chaplain and fiance at bedside

## 2020-06-03 NOTE — ED Notes (Signed)
Cancel stemi at this time

## 2020-06-03 NOTE — ED Triage Notes (Signed)
Pt presents via ems with c/o chest pain. EKG reviewed by MD Derrill Kay, code stemi activated. Chest pain began at about 5am. Pt alert and oriented x4.

## 2020-06-03 NOTE — ED Notes (Signed)
Pt placed on pads 

## 2020-06-03 NOTE — ED Notes (Signed)
Per Dr. Cassie Freer, no heparin at this time.

## 2020-06-04 ENCOUNTER — Inpatient Hospital Stay: Payer: Self-pay

## 2020-06-04 DIAGNOSIS — R739 Hyperglycemia, unspecified: Secondary | ICD-10-CM

## 2020-06-04 DIAGNOSIS — R0789 Other chest pain: Secondary | ICD-10-CM

## 2020-06-04 LAB — CBC
HCT: 40.7 % (ref 39.0–52.0)
Hemoglobin: 13.9 g/dL (ref 13.0–17.0)
MCH: 31.7 pg (ref 26.0–34.0)
MCHC: 34.2 g/dL (ref 30.0–36.0)
MCV: 92.7 fL (ref 80.0–100.0)
Platelets: 162 10*3/uL (ref 150–400)
RBC: 4.39 MIL/uL (ref 4.22–5.81)
RDW: 12.7 % (ref 11.5–15.5)
WBC: 4.6 10*3/uL (ref 4.0–10.5)
nRBC: 0 % (ref 0.0–0.2)

## 2020-06-04 LAB — TROPONIN I (HIGH SENSITIVITY): Troponin I (High Sensitivity): 4 ng/L (ref <18)

## 2020-06-04 LAB — HIV ANTIBODY (ROUTINE TESTING W REFLEX): HIV Screen 4th Generation wRfx: NONREACTIVE

## 2020-06-04 LAB — NM MYOCAR MULTI W/SPECT W/WALL MOTION / EF
Estimated workload: 1 METS
Exercise duration (min): 1 min
Exercise duration (sec): 3 s
LV dias vol: 123 mL (ref 62–150)
LV sys vol: 53 mL
MPHR: 104 {beats}/min
Peak HR: 102 {beats}/min
Percent HR: 55 %
Rest HR: 59 {beats}/min
SDS: 0
SRS: 6
SSS: 3
TID: 1.03

## 2020-06-04 LAB — HEMOGLOBIN A1C
Hgb A1c MFr Bld: 5.5 % (ref 4.8–5.6)
Mean Plasma Glucose: 111.15 mg/dL

## 2020-06-04 MED ORDER — TECHNETIUM TC 99M TETROFOSMIN IV KIT
31.0900 | PACK | Freq: Once | INTRAVENOUS | Status: AC | PRN
  Administered 2020-06-04: 31.09 via INTRAVENOUS
  Filled 2020-06-04: qty 32

## 2020-06-04 MED ORDER — REGADENOSON 0.4 MG/5ML IV SOLN
0.4000 mg | Freq: Once | INTRAVENOUS | Status: AC
  Administered 2020-06-04: 0.4 mg via INTRAVENOUS
  Filled 2020-06-04: qty 5

## 2020-06-04 MED ORDER — TECHNETIUM TC 99M TETROFOSMIN IV KIT
10.0000 | PACK | Freq: Once | INTRAVENOUS | Status: AC | PRN
  Administered 2020-06-04: 10.48 via INTRAVENOUS
  Filled 2020-06-04: qty 10

## 2020-06-04 NOTE — Progress Notes (Signed)
EKG completed and placed in patient's chart

## 2020-06-04 NOTE — Progress Notes (Signed)
[facility]  SUBJECTIVE: Patient laying in bed, feels better, less chest pain   Vitals:   06/04/20 0600 06/04/20 0700 06/04/20 0715 06/04/20 0730  BP: 106/65 106/66 113/73 111/67  Pulse: (!) 55 (!) 49 66 (!) 56  Resp: 13 13 15 16   Temp: 98 F (36.7 C)     TempSrc: Oral     SpO2: 98% 99% 99% 97%  Weight:      Height:        No intake or output data in the 24 hours ending 06/04/20 0750    PHYSICAL EXAM  General: Well developed, well nourished, in no acute distress HEENT:  Normocephalic and atramatic Neck:  No JVD.  Lungs: Clear bilaterally to auscultation and percussion. Heart: HRRR . Normal S1 and S2 without gallops or murmurs.  Abdomen: Bowel sounds are positive, abdomen soft and non-tender  Msk:  Back normal, normal gait. Normal strength and tone for age. Extremities: No clubbing, cyanosis or edema.   Neuro: Alert and oriented X 3. Psych:  Good affect, responds appropriately   LABS: Basic Metabolic Panel: Recent Labs    06/03/20 1747  NA 137  K 3.7  CL 102  CO2 25  GLUCOSE 144*  BUN 21*  CREATININE 0.89  CALCIUM 8.7*   Liver Function Tests: Recent Labs    06/03/20 1747  AST 27  ALT 20  ALKPHOS 80  BILITOT 1.0  PROT 7.2  ALBUMIN 4.3   No results for input(s): LIPASE, AMYLASE in the last 72 hours. CBC: Recent Labs    06/03/20 1747  WBC 4.4  NEUTROABS 2.6  HGB 14.2  HCT 40.6  MCV 91.0  PLT 172   Cardiac Enzymes: No results for input(s): CKTOTAL, CKMB, CKMBINDEX, TROPONINI in the last 72 hours. BNP: Invalid input(s): POCBNP D-Dimer: No results for input(s): DDIMER in the last 72 hours. Hemoglobin A1C: No results for input(s): HGBA1C in the last 72 hours. Fasting Lipid Panel: Recent Labs    06/03/20 1747  CHOL 174  HDL 48  LDLCALC 103*  TRIG 115  CHOLHDL 3.6   Thyroid Function Tests: No results for input(s): TSH, T4TOTAL, T3FREE, THYROIDAB in the last 72 hours.  Invalid input(s): FREET3 Anemia Panel: No results for  input(s): VITAMINB12, FOLATE, FERRITIN, TIBC, IRON, RETICCTPCT in the last 72 hours.  DG Chest Portable 1 View  Result Date: 06/03/2020 CLINICAL DATA:  Left-sided chest pain for several hours EXAM: PORTABLE CHEST 1 VIEW COMPARISON:  05/18/2020 FINDINGS: Cardiac shadow is within normal limits. The lungs are well aerated bilaterally. Old pacing lead is noted over the left chest stable from the prior study. No focal infiltrate is seen. No bony abnormality is noted. IMPRESSION: No active disease. Electronically Signed   By: 07/18/2020 M.D.   On: 06/03/2020 18:38     Echo   TELEMETRY: Sinus rhythm:  ASSESSMENT AND PLAN:  Active Problems:   ACS (acute coronary syndrome) (HCC)    1.  Chest pain, with typical and atypical features, abnormal ECG, nondiagnostic for ST elevation myocardial infarction, late presentation, duration of chest pain greater than 12 hours, with normal high sensitive troponin x2, suggest noncardiac etiology of chest pain 2.  Essential hypertension, blood pressure in normal range  Recommendations  1.  Agree with current therapy 2.  Defer heparin drip 3.  Lexiscan Myoview today 4.  If Lexiscan Myoview study negative,  then DC home, follow-up with Dr. 06/05/2020 in 1 week   Gwen Pounds, MD, PhD, [facility] 06/04/2020 7:50 AM

## 2020-06-04 NOTE — Discharge Summary (Signed)
Physician Discharge Summary  Ricky Green WVP:[id number] DOB: [birth year] DOA: 06/03/2020  PCP: Patient, No Pcp Per  Admit date: 06/03/2020 Discharge date: 06/04/2020  Admitted From: home Disposition:  home  Recommendations for Outpatient Follow-up:  1. Follow up with PCP in 1-2 weeks 2. F/u cardio, Dr. Gwen Pounds, in 1 week   Home Health: no  Equipment/Devices:  Discharge Condition: stable CODE STATUS: full  Diet recommendation: Heart Healthy  Brief/Interim Summary: HPI was taken from Dr. Arville Care: [name]  is a 34 y.o. male with a known history of hypertension, who presented to the emergency room with acute onset of midsternal chest pain felt as a pressure and sharp pain with associated diaphoresis, dyspnea and palpitations are started this morning and has been intermittent since then.  He denied any nausea or vomiting.  No leg pain or edema recent travels or surgeries.  It has been radiating to his left arm.  He was seen on 05/18/2020 in the ER and had a couple sets of negative troponins and plans were made for cardiac stress test and event monitor.  He denied any abdominal pain or melena or rectal bleeding per rectum.  No bleeding diathesis.  Upon presentation to the emergency room, heart rate was 69 and later 56 and otherwise vital signs were within normal.  Labs revealed unremarkable CMP and high-sensitivity troponin I of 5 mL 3.  CBC was unremarkable.  Lipid profile showed an LDL of 115 with HDL of 48 total cholesterol 174 with triglyceride of 115.  COVID-19 PCR came back negative.  Portable chest ray showed no acute cardiopulmonary disease.   EKG showed normal sinus rhythm with a rate of 66 with 1 mm ST segment elevation in V2 possibly related to early repolarization and T wave inversion in V1 and V3 with flattened T waves in V5 and V6 and inferior T wave inversion with ST segment depression.  Contact was made with Dr. Darrold Junker who is aware about the patient.  The patient was given  40 aspirin, 50 mcg of IV fentanyl and IV heparin.  He will be admitted to a progressive unit bed for further evaluation and management of suspected acute coronary syndrome.  Hospital course from Dr. Wilfred Lacy 06/04/20: Pt presented w/ chest pain concerning w/ possible ACS. ST elevations were noted on pt's EKG on admission. Cardio was consulted and cardiac stress test was done and was normal. Pt will f/u w/ cardio, Dr. Gwen Pounds in 1 week   Discharge Diagnoses:  Active Problems:   ACS (acute coronary syndrome) (HCC) Chest pain:  with abnormal EKG. Cardiac stress test was normal. Continue on tele. Nitro, morphine prn. F/u w/ cardio in 1 week    Hyperglycemia: no hx of DM. HbA1c 5.5.    Discharge Instructions  Discharge Instructions    Diet general   Complete by: As directed    Discharge instructions   Complete by: As directed    F/u PCP in 2 weeks. F/u cardio, Dr. Gwen Pounds, in 1 week   Increase activity slowly   Complete by: As directed      Allergies as of 06/04/2020      Reactions   Hydrocodone Swelling   Swelling to face      Medication List    TAKE these medications   aspirin 81 MG EC tablet Take by mouth.   ibuprofen 200 MG tablet Commonly known as: ADVIL Take 400 mg by mouth every 6 (six) hours as needed.       Follow-up Information  Lamar BlinksKowalski, Bruce J, MD Follow up in 1 week(s).   Specialty: Cardiology Contact information: [address] [facility] [place] [id number] [phone]              Allergies  Allergen Reactions  . Hydrocodone Swelling    Swelling to face    Consultations:  Cardio, Dr. Darrold JunkerParaschos   Procedures/Studies: DG Chest 2 View  Result Date: 05/18/2020 CLINICAL DATA:  Chest pain EXAM: CHEST - 2 VIEW COMPARISON:  None. FINDINGS: Lungs are clear. Heart size and pulmonary vascularity are normal. 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 No  pneumothorax. No adenopathy. No bone lesions. IMPRESSION: Lungs clear. Heart size normal. 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 No pneumothorax. Electronically Signed   By: Bretta BangWilliam  Woodruff III M.D.   On: 05/18/2020 09:39   CT Angio Chest PE W and/or Wo Contrast  Result Date: 05/18/2020 CLINICAL DATA:  34 year old male with concern for pulmonary embolism. EXAM: CT ANGIOGRAPHY CHEST WITH CONTRAST TECHNIQUE: Multidetector CT imaging of the chest was performed using the standard protocol during bolus administration of intravenous contrast. Multiplanar CT image reconstructions and MIPs were obtained to evaluate the vascular anatomy. CONTRAST:  75mL OMNIPAQUE IOHEXOL 350 MG/ML SOLN COMPARISON:  Chest radiograph dated 05/18/2020. FINDINGS: Cardiovascular: There is no cardiomegaly or pericardial effusion. The thoracic aorta is unremarkable. Evaluation of the pulmonary arteries is limited due to suboptimal opacification as well as secondary to respiratory motion artifact. No pulmonary artery embolus identified. Mediastinum/Nodes: There is no hilar or mediastinal adenopathy. The esophagus is grossly unremarkable. No mediastinal fluid collection. Lungs/Pleura: The lungs are clear. There is no pleural effusion pneumothorax. The central airways are patent. Upper Abdomen: No acute abnormality. Musculoskeletal: No chest wall abnormality. No acute or significant osseous findings. Review of the MIP images confirms the above findings. IMPRESSION: No acute intrathoracic pathology. No CT evidence of pulmonary embolism. Electronically Signed   By: Elgie CollardArash  Radparvar M.D.   On: 05/18/2020 19:42   NM Myocar Multi W/Spect W/Wall Motion / EF  Result Date: 06/04/2020  Blood pressure demonstrated a normal response to exercise.  The study is normal.  This is a low risk study.  The left ventricular ejection fraction is normal (55-65%).    DG Chest Portable 1 View  Result Date: 06/03/2020 CLINICAL DATA:   Left-sided chest pain for several hours EXAM: PORTABLE CHEST 1 VIEW COMPARISON:  05/18/2020 FINDINGS: Cardiac shadow is within normal limits. The lungs are well aerated bilaterally. Old pacing lead is noted over the left chest stable from the prior study. No focal infiltrate is seen. No bony abnormality is noted. IMPRESSION: No active disease. Electronically Signed   By: Alcide CleverMark  Lukens M.D.   On: 06/03/2020 18:38      Subjective: Pt c/o fatigue    Discharge Exam: Vitals:   06/04/20 1336 06/04/20 1514  BP: (!) 114/56 112/72  Pulse: 60 61  Resp: 18 17  Temp:  97.7 F (36.5 C)  SpO2: 99% 100%   Vitals:   06/04/20 0730 06/04/20 0800 06/04/20 1336 06/04/20 1514  BP: 111/67 110/70 (!) 114/56 112/72  Pulse: (!) 56 (!) 56 60 61  Resp: 16 16 18 17   Temp:    97.7 F (36.5 C)  TempSrc:    Oral  SpO2: 97% 97% 99% 100%  Weight:      Height:        General: Pt is alert, awake,  not in acute distress Cardiovascular:S1/S2 +, no rubs, no gallops Respiratory: CTA bilaterally, no wheezing, no rhonchi Abdominal: Soft, NT, ND, bowel sounds + Extremities: no edema, no cyanosis    The results of significant diagnostics from this hospitalization (including imaging, microbiology, ancillary and laboratory) are listed below for reference.     Microbiology: Recent Results (from the past 240 hour(s))  SARS Coronavirus 2 by RT PCR (hospital order, performed in [facility] hospital lab) Nasopharyngeal Nasopharyngeal Swab     Status: None   Collection Time: 06/03/20  5:40 PM   Specimen: Nasopharyngeal Swab  Result Value Ref Range Status   SARS Coronavirus 2 NEGATIVE NEGATIVE Final    Comment: (NOTE) SARS-CoV-2 target nucleic acids are NOT DETECTED.  The SARS-CoV-2 RNA is generally detectable in upper and lower respiratory specimens during the acute phase of infection. The lowest concentration of SARS-CoV-2 viral copies this assay can detect is 250 copies / mL. A negative result does not preclude  SARS-CoV-2 infection and should not be used as the sole basis for treatment or other patient management decisions.  A negative result may occur with improper specimen collection / handling, submission of specimen other than nasopharyngeal swab, presence of viral mutation(s) within the areas targeted by this assay, and inadequate number of viral copies (<250 copies / mL). A negative result must be combined with clinical observations, patient history, and epidemiological information.  Fact Sheet for Patients:   BoilerBrush.com.cy  Fact Sheet for Healthcare Providers: https://pope.com/  This test is not yet approved or  cleared by the [place] FDA and has been authorized for detection and/or diagnosis of SARS-CoV-2 by FDA under an Emergency Use Authorization (EUA).  This EUA will remain in effect (meaning this test can be used) for the duration of the COVID-19 declaration under Section 564(b)(1) of the Act, 21 U.S.C. section 360bbb-3(b)(1), unless the authorization is terminated or revoked sooner.  Performed at [facility], [address] [place], [place] [address]      Labs: BNP (last 3 results) No results for input(s): BNP in the last 8760 hours. Basic Metabolic Panel: Recent Labs  Lab 06/03/20 1747  NA 137  K 3.7  CL 102  CO2 25  GLUCOSE 144*  BUN 21*  CREATININE 0.89  CALCIUM 8.7*   Liver Function Tests: Recent Labs  Lab 06/03/20 1747  AST 27  ALT 20  ALKPHOS 80  BILITOT 1.0  PROT 7.2  ALBUMIN 4.3   No results for input(s): LIPASE, AMYLASE in the last 168 hours. No results for input(s): AMMONIA in the last 168 hours. CBC: Recent Labs  Lab 06/03/20 1747 06/04/20 0721  WBC 4.4 4.6  NEUTROABS 2.6  --   HGB 14.2 13.9  HCT 40.6 40.7  MCV 91.0 92.7  PLT 172 162   Cardiac Enzymes: No results for input(s): CKTOTAL, CKMB, CKMBINDEX, TROPONINI in the last 168 hours. BNP: Invalid input(s):  POCBNP CBG: No results for input(s): GLUCAP in the last 168 hours. D-Dimer No results for input(s): DDIMER in the last 72 hours. Hgb A1c Recent Labs    06/03/20 1747  HGBA1C 5.5   Lipid Profile Recent Labs    06/03/20 1747  CHOL 174  HDL 48  LDLCALC 103*  TRIG 115  CHOLHDL 3.6   Thyroid function studies No results for input(s): TSH, T4TOTAL, T3FREE, THYROIDAB in the last 72 hours.  Invalid input(s): FREET3 Anemia work up No results for input(s): VITAMINB12, FOLATE, FERRITIN, TIBC, IRON, RETICCTPCT in the last 72  hours. Urinalysis    Component Value Date/Time   COLORURINE YELLOW 05/14/2010 2150   APPEARANCEUR CLOUDY (A) 05/14/2010 2150   LABSPEC 1.031 (H) 05/14/2010 2150   PHURINE 6.0 05/14/2010 2150   GLUCOSEU NEGATIVE 05/14/2010 2150   HGBUR NEGATIVE 05/14/2010 2150   BILIRUBINUR NEGATIVE 05/14/2010 2150   KETONESUR NEGATIVE 05/14/2010 2150   PROTEINUR 100 (A) 05/14/2010 2150   UROBILINOGEN 0.2 05/14/2010 2150   NITRITE NEGATIVE 05/14/2010 2150   LEUKOCYTESUR NEGATIVE 05/14/2010 2150   Sepsis Labs Invalid input(s): PROCALCITONIN,  WBC,  LACTICIDVEN Microbiology Recent Results (from the past 240 hour(s))  SARS Coronavirus 2 by RT PCR (hospital order, performed in [facility] hospital lab) Nasopharyngeal Nasopharyngeal Swab     Status: None   Collection Time: 06/03/20  5:40 PM   Specimen: Nasopharyngeal Swab  Result Value Ref Range Status   SARS Coronavirus 2 NEGATIVE NEGATIVE Final    Comment: (NOTE) SARS-CoV-2 target nucleic acids are NOT DETECTED.  The SARS-CoV-2 RNA is generally detectable in upper and lower respiratory specimens during the acute phase of infection. The lowest concentration of SARS-CoV-2 viral copies this assay can detect is 250 copies / mL. A negative result does not preclude SARS-CoV-2 infection and should not be used as the sole basis for treatment or other patient management decisions.  A negative result may occur with improper  specimen collection / handling, submission of specimen other than nasopharyngeal swab, presence of viral mutation(s) within the areas targeted by this assay, and inadequate number of viral copies (<250 copies / mL). A negative result must be combined with clinical observations, patient history, and epidemiological information.  Fact Sheet for Patients:   BoilerBrush.com.cy  Fact Sheet for Healthcare Providers: https://pope.com/  This test is not yet approved or  cleared by the [place] FDA and has been authorized for detection and/or diagnosis of SARS-CoV-2 by FDA under an Emergency Use Authorization (EUA).  This EUA will remain in effect (meaning this test can be used) for the duration of the COVID-19 declaration under Section 564(b)(1) of the Act, 21 U.S.C. section 360bbb-3(b)(1), unless the authorization is terminated or revoked sooner.  Performed at [facility], [address], [place], [place] [address]      Time coordinating discharge: Over 30 minutes  SIGNED:   Charise Killian, MD  [facility] 06/04/2020, 3:56 PM Pager   If 7PM-7AM, please contact night-coverage [email or web address]

## 2020-06-04 NOTE — Progress Notes (Signed)
CH reponded to CODE STEMI in ED; when [facility] arrived, pt. lying in bed w/EKG leads connected, code cart at bedside, fiance standing by wall.  Pt. shared he woke up this AM with chest pain but could not come to ED until his fiance came home from work in the afternoon; he said he had a similar episode of chest pain earlier this summer.  Cardiologist entered mid-visit --> assessed not CODE STEMI situation, but suggested pt. remain in hospital overnight; pt. willing to do this but not excited.  Fiance shared she is 1st grade teacher; she has just begun the year and feels overwhelmed as her 1st grade students never got acclimated to classroom environment after spending Kindergarten in online classes.  CH provided emotional support; Ch remains available as needed.

## 2020-06-04 NOTE — ED Notes (Signed)
Pt to Nuc Med for stress test.  ?

## 2021-03-15 ENCOUNTER — Observation Stay (HOSPITAL_BASED_OUTPATIENT_CLINIC_OR_DEPARTMENT_OTHER)
Admit: 2021-03-15 | Discharge: 2021-03-15 | Disposition: A | Discharge status: Home / Self Care | Payer: Self-pay | Attending: Internal Medicine | Admitting: Internal Medicine

## 2021-03-15 ENCOUNTER — Encounter: Payer: Self-pay | Admitting: Internal Medicine

## 2021-03-15 ENCOUNTER — Emergency Department: Payer: Self-pay

## 2021-03-15 ENCOUNTER — Observation Stay
Admission: EM | Admit: 2021-03-15 | Discharge: 2021-03-15 | Disposition: A | Discharge status: Home / Self Care | Payer: Self-pay | Attending: Internal Medicine | Admitting: Internal Medicine

## 2021-03-15 ENCOUNTER — Encounter
Admission: EM | Disposition: A | Discharge status: Home / Self Care | Payer: Self-pay | Source: Home / Self Care | Attending: Emergency Medicine

## 2021-03-15 DIAGNOSIS — I213 ST elevation (STEMI) myocardial infarction of unspecified site: Secondary | ICD-10-CM

## 2021-03-15 DIAGNOSIS — R079 Chest pain, unspecified: Secondary | ICD-10-CM | POA: Diagnosis present

## 2021-03-15 DIAGNOSIS — R9431 Abnormal electrocardiogram [ECG] [EKG]: Secondary | ICD-10-CM

## 2021-03-15 DIAGNOSIS — Z7982 Long term (current) use of aspirin: Secondary | ICD-10-CM | POA: Insufficient documentation

## 2021-03-15 DIAGNOSIS — I2510 Atherosclerotic heart disease of native coronary artery without angina pectoris: Principal | ICD-10-CM | POA: Insufficient documentation

## 2021-03-15 DIAGNOSIS — Z20822 Contact with and (suspected) exposure to covid-19: Secondary | ICD-10-CM | POA: Insufficient documentation

## 2021-03-15 DIAGNOSIS — I10 Essential (primary) hypertension: Secondary | ICD-10-CM | POA: Insufficient documentation

## 2021-03-15 HISTORY — PX: LEFT HEART CATH AND CORONARY ANGIOGRAPHY: CATH118249

## 2021-03-15 LAB — COMPREHENSIVE METABOLIC PANEL
ALT: 24 U/L (ref 0–44)
AST: 31 U/L (ref 15–41)
Albumin: 4.1 g/dL (ref 3.5–5.0)
Alkaline Phosphatase: 87 U/L (ref 38–126)
Anion gap: 8 (ref 5–15)
BUN: 17 mg/dL (ref 6–20)
CO2: 25 mmol/L (ref 22–32)
Calcium: 8.7 mg/dL — ABNORMAL LOW (ref 8.9–10.3)
Chloride: 106 mmol/L (ref 98–111)
Creatinine, Ser: 0.77 mg/dL (ref 0.61–1.24)
GFR, Estimated: >60 mL/min (ref >60)
Glucose, Bld: 113 mg/dL — ABNORMAL HIGH (ref 70–99)
Potassium: 3.8 mmol/L (ref 3.5–5.1)
Sodium: 139 mmol/L (ref 135–145)
Total Bilirubin: 0.9 mg/dL (ref 0.3–1.2)
Total Protein: 7 g/dL (ref 6.5–8.1)

## 2021-03-15 LAB — CBC WITH DIFFERENTIAL/PLATELET
Abs Immature Granulocytes: 0.02 10*3/uL (ref 0.00–0.07)
Basophils Absolute: 0 10*3/uL (ref 0.0–0.1)
Basophils Relative: 1 %
Eosinophils Absolute: 0.1 10*3/uL (ref 0.0–0.5)
Eosinophils Relative: 2 %
HCT: 43.6 % (ref 39.0–52.0)
Hemoglobin: 15.2 g/dL (ref 13.0–17.0)
Immature Granulocytes: 0 %
Lymphocytes Relative: 35 %
Lymphs Abs: 1.8 10*3/uL (ref 0.7–4.0)
MCH: 31.6 pg (ref 26.0–34.0)
MCHC: 34.9 g/dL (ref 30.0–36.0)
MCV: 90.6 fL (ref 80.0–100.0)
Monocytes Absolute: 0.4 10*3/uL (ref 0.1–1.0)
Monocytes Relative: 8 %
Neutro Abs: 2.7 10*3/uL (ref 1.7–7.7)
Neutrophils Relative %: 54 %
Platelets: 158 10*3/uL (ref 150–400)
RBC: 4.81 MIL/uL (ref 4.22–5.81)
RDW: 12.3 % (ref 11.5–15.5)
WBC: 5 10*3/uL (ref 4.0–10.5)
nRBC: 0 % (ref 0.0–0.2)

## 2021-03-15 LAB — ECHOCARDIOGRAM COMPLETE
AR max vel: 2.62 cm2
AV Area VTI: 2.62 cm2
AV Area mean vel: 2.68 cm2
AV Mean grad: 2 mmHg
AV Peak grad: 3.7 mmHg
Ao pk vel: 0.96 m/s
Area-P 1/2: 3.13 cm2
Height: 65 in
S' Lateral: 2.89 cm
Weight: 2624 oz

## 2021-03-15 LAB — PROTIME-INR
INR: 0.9 (ref 0.8–1.2)
Prothrombin Time: 12.4 seconds (ref 11.4–15.2)

## 2021-03-15 LAB — RESP PANEL BY RT-PCR (FLU A&B, COVID) ARPGX2
Influenza A by PCR: NEGATIVE
Influenza B by PCR: NEGATIVE
SARS Coronavirus 2 by RT PCR: NEGATIVE

## 2021-03-15 LAB — D-DIMER, QUANTITATIVE: D-Dimer, Quant: 0.29 ug/mL-FEU (ref 0.00–0.50)

## 2021-03-15 LAB — TROPONIN I (HIGH SENSITIVITY)
Troponin I (High Sensitivity): 7 ng/L (ref <18)
Troponin I (High Sensitivity): 8 ng/L (ref <18)

## 2021-03-15 SURGERY — LEFT HEART CATH AND CORONARY ANGIOGRAPHY
Anesthesia: Moderate Sedation

## 2021-03-15 MED ORDER — VERAPAMIL HCL 2.5 MG/ML IV SOLN
INTRAVENOUS | Status: DC | PRN
  Administered 2021-03-15: 2.5 mg via INTRA_ARTERIAL

## 2021-03-15 MED ORDER — ASPIRIN EC 81 MG PO TBEC: 81.0000 mg | DELAYED_RELEASE_TABLET | Freq: Every day | ORAL | Status: DC

## 2021-03-15 MED ORDER — VERAPAMIL HCL 2.5 MG/ML IV SOLN
INTRAVENOUS | Status: AC
  Filled 2021-03-15: qty 2

## 2021-03-15 MED ORDER — SODIUM CHLORIDE 0.9% FLUSH: 3.0000 mL | INTRAVENOUS | Status: DC | PRN

## 2021-03-15 MED ORDER — ALBUTEROL SULFATE HFA 108 (90 BASE) MCG/ACT IN AERS
2.0000 | INHALATION_SPRAY | RESPIRATORY_TRACT | Status: DC | PRN
  Filled 2021-03-15: qty 6.7

## 2021-03-15 MED ORDER — IOHEXOL 300 MG/ML  SOLN
INTRAMUSCULAR | Status: DC | PRN
  Administered 2021-03-15: 47 mL

## 2021-03-15 MED ORDER — HYDRALAZINE HCL 20 MG/ML IJ SOLN: 5.0000 mg | INTRAMUSCULAR | Status: DC | PRN

## 2021-03-15 MED ORDER — SODIUM CHLORIDE 0.9 % IV SOLN: INTRAVENOUS | Status: AC

## 2021-03-15 MED ORDER — ONDANSETRON HCL 4 MG/2ML IJ SOLN: 4.0000 mg | Freq: Four times a day (QID) | INTRAMUSCULAR | Status: DC | PRN

## 2021-03-15 MED ORDER — HEPARIN (PORCINE) 25000 UT/250ML-% IV SOLN: 900.0000 [IU]/h | INTRAVENOUS | Status: DC

## 2021-03-15 MED ORDER — NITROGLYCERIN 0.4 MG SL SUBL
0.4000 mg | SUBLINGUAL_TABLET | SUBLINGUAL | Status: DC | PRN
  Administered 2021-03-15: 0.4 mg via SUBLINGUAL
  Filled 2021-03-15: qty 1

## 2021-03-15 MED ORDER — ACETAMINOPHEN 325 MG PO TABS: 650.0000 mg | ORAL_TABLET | ORAL | Status: DC | PRN

## 2021-03-15 MED ORDER — HEPARIN (PORCINE) IN NACL 1000-0.9 UT/500ML-% IV SOLN
INTRAVENOUS | Status: DC | PRN
  Administered 2021-03-15 (×2): 500 mL

## 2021-03-15 MED ORDER — SODIUM CHLORIDE 0.9% FLUSH: 3.0000 mL | Freq: Two times a day (BID) | INTRAVENOUS | Status: DC

## 2021-03-15 MED ORDER — HEPARIN SODIUM (PORCINE) 1000 UNIT/ML IJ SOLN
INTRAMUSCULAR | Status: AC
  Filled 2021-03-15: qty 1

## 2021-03-15 MED ORDER — ASPIRIN 81 MG PO TBEC: 81.0000 mg | DELAYED_RELEASE_TABLET | Freq: Every day | ORAL | 1 refills | Status: AC

## 2021-03-15 MED ORDER — FENTANYL CITRATE (PF) 100 MCG/2ML IJ SOLN
25.0000 ug | INTRAMUSCULAR | Status: DC | PRN
Start: 2021-03-15 — End: 2021-03-15

## 2021-03-15 MED ORDER — HEPARIN SODIUM (PORCINE) 1000 UNIT/ML IJ SOLN
4000.0000 [IU] | Freq: Once | INTRAMUSCULAR | Status: AC
  Administered 2021-03-15: 4000 [IU] via INTRAVENOUS

## 2021-03-15 MED ORDER — ENOXAPARIN SODIUM 40 MG/0.4ML IJ SOSY
40.0000 mg | PREFILLED_SYRINGE | INTRAMUSCULAR | Status: DC
  Filled 2021-03-15: qty 0.4

## 2021-03-15 MED ORDER — FENTANYL CITRATE (PF) 100 MCG/2ML IJ SOLN
50.0000 ug | Freq: Once | INTRAMUSCULAR | Status: AC
  Administered 2021-03-15: 50 ug via INTRAVENOUS
  Filled 2021-03-15: qty 2

## 2021-03-15 MED ORDER — ENOXAPARIN SODIUM 40 MG/0.4ML IJ SOSY: 40.0000 mg | PREFILLED_SYRINGE | INTRAMUSCULAR | Status: DC

## 2021-03-15 MED ORDER — ONDANSETRON HCL 4 MG/2ML IJ SOLN: 4.0000 mg | Freq: Three times a day (TID) | INTRAMUSCULAR | Status: DC | PRN

## 2021-03-15 MED ORDER — MIDAZOLAM HCL 2 MG/2ML IJ SOLN
INTRAMUSCULAR | Status: AC
  Filled 2021-03-15: qty 2

## 2021-03-15 MED ORDER — ACETAMINOPHEN 650 MG RE SUPP
650.0000 mg | Freq: Four times a day (QID) | RECTAL | Status: DC | PRN
  Filled 2021-03-15: qty 1

## 2021-03-15 MED ORDER — HEPARIN (PORCINE) IN NACL 1000-0.9 UT/500ML-% IV SOLN
INTRAVENOUS | Status: AC
  Filled 2021-03-15: qty 1000

## 2021-03-15 MED ORDER — SODIUM CHLORIDE 0.9 % IV SOLN: 250.0000 mL | INTRAVENOUS | Status: DC | PRN

## 2021-03-15 MED ORDER — ASPIRIN 81 MG PO CHEW
324.0000 mg | CHEWABLE_TABLET | Freq: Once | ORAL | Status: AC
  Administered 2021-03-15: 324 mg via ORAL

## 2021-03-15 MED ORDER — FENTANYL CITRATE (PF) 100 MCG/2ML IJ SOLN
INTRAMUSCULAR | Status: AC
  Filled 2021-03-15: qty 2

## 2021-03-15 SURGICAL SUPPLY — 15 items
CATH INFINITI 5 FR JL3.5 (CATHETERS) ×2 IMPLANT
CATH INFINITI 5FR ANG PIGTAIL (CATHETERS) ×2 IMPLANT
CATH INFINITI 5FR JK (CATHETERS) ×2 IMPLANT
DEVICE RAD TR BAND REGULAR (VASCULAR PRODUCTS) ×2 IMPLANT
DRAPE BRACHIAL (DRAPES) ×2 IMPLANT
DRAPE FEMORAL ANGIO W/ POUCH (DRAPES) ×2 IMPLANT
GLIDESHEATH SLEND SS 6F .021 (SHEATH) ×2 IMPLANT
GUIDEWIRE INQWIRE 1.5J.035X260 (WIRE) ×1 IMPLANT
INQWIRE 1.5J .035X260CM (WIRE) ×2
KIT ENCORE 26 ADVANTAGE (KITS) IMPLANT
KIT SYRINGE INJ CVI SPIKEX1 (MISCELLANEOUS) ×2 IMPLANT
PACK CARDIAC CATH (CUSTOM PROCEDURE TRAY) ×2 IMPLANT
PROTECTION STATION PRESSURIZED (MISCELLANEOUS) ×2
SET ATX SIMPLICITY (MISCELLANEOUS) ×2 IMPLANT
STATION PROTECTION PRESSURIZED (MISCELLANEOUS) ×1 IMPLANT

## 2021-03-15 NOTE — ED Notes (Signed)
CALLED [facility] (TAMMY) FOR STEMI

## 2021-03-15 NOTE — Progress Notes (Signed)
   03/15/21 0710  Clinical Encounter Type  Visited With Patient  Visit Type Initial;ED;Code  Referral From  (Code STEMI)  Consult/Referral To Chaplain  Spiritual Encounters  Spiritual Needs Other (Comment) (Support for family if they arrive at ED)  Ricky Green met briefly with patient upon arrival in ED. Chaplain provided a calming, compassionate presence. Chaplain also offered to meet spouse and reassured patient of this.

## 2021-03-15 NOTE — ED Notes (Signed)
All pt belongings placed in pt specific bag and given to pt

## 2021-03-15 NOTE — ED Notes (Signed)
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

## 2021-03-15 NOTE — ED Triage Notes (Signed)
Pt arrives with central chest pain for 3 days, worsening, code stemi called.

## 2021-03-15 NOTE — Progress Notes (Signed)
*  PRELIMINARY RESULTS* Echocardiogram 2D Echocardiogram has been performed.  Cristela Blue 03/15/2021, 1:14 PM

## 2021-03-15 NOTE — Discharge Instructions (Signed)
You were cared for by a hospitalist during your hospital stay. If you have any questions about your discharge medications or the care you received while you were in the hospital after you are discharged, you can call the unit and ask to speak with the hospitalist on call if the hospitalist that took care of you is not available. Once you are discharged, your primary care physician will handle any further medical issues. Please note that NO REFILLS for any discharge medications will be authorized once you are discharged, as it is imperative that you return to your primary care physician (or establish a relationship with a primary care physician if you do not have one) for your aftercare needs so that they can reassess your need for medications and monitor your lab values.  1.Per Dr. Kirke Corin, you do not have heart attack.  Your 2D echo ultrasound of heart is negative. 2. You need to follow up with Dr. Gwen Pounds of cardiology. Take all medications as prescribed. If symptoms change or worsen please return to the ED for evaluation

## 2021-03-15 NOTE — H&P (Addendum)
History and Physical    Ricky Green JYN:[id number] DOB: 1986-08-14 DOA: 03/15/2021  Referring MD/NP/PA:   PCP: Pcp, No   Patient coming from:  The patient is coming from home.  At baseline, pt is independent for most of ADL.        Chief Complaint: chest pain  HPI: Ricky HarderJavier Green is a 35 y.o. 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  Pt states that he is chest pain has been going on for more than 3 days.  It is located in the left side of the chest, 8 out of 10 in severity initially, currently 2 out of 10 severity, pressure, nonradiating.  Patient states that his chest pain is slightly worse when he takes deep breath and holding his breath.  No tenderness in the calf areas.  No long distant traveling recently.  He has some mild shortness of breath, no cough, fever or chills.  Denies nausea vomiting, diarrhea or abdominal pain.  No symptoms of UTI.   Today patient's EKG showed ST elevation in V1-V3.  Dr. Kary KosArita of cardiology is consulted.  Patient underwent urgent cardiac catheterization, which showed nearly normal coronary artery.  Cardiac catheterization by Dr. Kirke CorinArida today:   Mid LAD lesion is 10% stenosed.  The left ventricular systolic function is normal.  LV end diastolic pressure is mildly elevated.  The left ventricular ejection fraction is 55-65% by visual estimate.  Of note, patient had chest pain last year and underwent unremarkable Myoview study in August 2021. Pt also had negative CTA for PE on 05/18/20  Myoview test 06/04/20:  Blood pressure demonstrated a normal response to exercise.  The study is normal.  This is a low risk study.  The left ventricular ejection fraction is normal (55-65%).  ED Course: pt was found to have troponin level 7, INR 0.9, negative COVID-19 PCR, electrolytes renal function okay,  temperature normal, blood pressure 118/93, heart rate 59, 65, RR 21, oxygen saturation 95% on room air.  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 Pt is placed on progressive bed for observation.  Review of Systems:   General: no fevers, chills, no body weight gain, has fatigue HEENT: no blurry vision, hearing changes or sore throat Respiratory: has mild dyspnea, no coughing, wheezing CV: has chest pain, no palpitations GI: no nausea, vomiting, abdominal pain, diarrhea, constipation GU: no dysuria, burning on urination, increased urinary frequency, hematuria  Ext: no leg edema Neuro: no unilateral weakness, numbness, or tingling, no vision change or hearing loss Skin: no rash, no skin tear. MSK: No muscle spasm, no deformity, no limitation of range of movement in spin Heme: No easy bruising.  Travel history: No recent long distant travel.  Allergy:  Allergies  Allergen Reactions  . Hydrocodone Swelling    Swelling to face    Past Medical History:  Diagnosis Date  . Hypertension     Past Surgical History:  Procedure Laterality Date  . pacemaker insertion and removal      Social History:  reports that he has never smoked. He has never used smokeless tobacco. He reports current alcohol use. He reports that he does not use drugs.  Family History:  Family History  Problem Relation Age of Onset  . Heart disease Maternal Grandmother      Prior to Admission medications  Medication Sig Start Date End Date Taking? Authorizing Provider  aspirin 81 MG EC tablet Take by mouth.    [provider]  ibuprofen (ADVIL,MOTRIN) 200 MG tablet Take 400 mg by mouth every 6 (six) hours as needed.     [provider]    Physical Exam: Vitals:   03/15/21 0733 03/15/21 0811 03/15/21 0830 03/15/21 0845  BP:  (!) 121/92 113/85 113/77  Pulse:  61 61 (!) 57  Resp:  17 15 16   Temp:  97.7 F (36.5 C)    TempSrc:   Oral    SpO2: 95% 98% 99% 99%  Weight:      Height:       General: Not in acute distress HEENT:       Eyes: PERRL, EOMI, no scleral icterus.       ENT: No discharge from the ears and nose, no pharynx injection, no tonsillar enlargement.        Neck: No JVD, no bruit, no mass felt. Heme: No neck lymph node enlargement. Cardiac: S1/S2, RRR, No murmurs, No gallops or rubs. Respiratory: No rales, wheezing, rhonchi or rubs. GI: Soft, nondistended, nontender, no rebound pain, no organomegaly, BS present. GU: No hematuria Ext: No pitting leg edema bilaterally. 1+DP/PT pulse bilaterally. Musculoskeletal: No joint deformities, No joint redness or warmth, no limitation of ROM in spin. Skin: No rashes.  Neuro: Alert, oriented X3, cranial nerves II-XII grossly intact, moves all extremities normally. Psych: Patient is not psychotic, no suicidal or hemocidal ideation.  Labs on Admission: I have personally reviewed following labs and imaging studies  CBC: Recent Labs  Lab 03/15/21 0658  WBC 5.0  NEUTROABS 2.7  HGB 15.2  HCT 43.6  MCV 90.6  PLT 158   Basic Metabolic Panel: Recent Labs  Lab 03/15/21 0658  NA 139  K 3.8  CL 106  CO2 25  GLUCOSE 113*  BUN 17  CREATININE 0.77  CALCIUM 8.7*   GFR: Estimated Creatinine Clearance: 120.4 mL/min (by C-G formula based on SCr of 0.77 mg/dL). Liver Function Tests: Recent Labs  Lab 03/15/21 0658  AST 31  ALT 24  ALKPHOS 87  BILITOT 0.9  PROT 7.0  ALBUMIN 4.1   No results for input(s): LIPASE, AMYLASE in the last 168 hours. No results for input(s): AMMONIA in the last 168 hours. Coagulation Profile: Recent Labs  Lab 03/15/21 0658  INR 0.9   Cardiac Enzymes: No results for input(s): CKTOTAL, CKMB, CKMBINDEX, TROPONINI in the last 168 hours. BNP (last 3 results) No results for input(s): PROBNP in the last 8760 hours. HbA1C: No results for input(s): HGBA1C in the last 72 hours. CBG: No results for input(s): GLUCAP in the  last 168 hours. Lipid Profile: No results for input(s): CHOL, HDL, LDLCALC, TRIG, CHOLHDL, LDLDIRECT in the last 72 hours. Thyroid Function Tests: No results for input(s): TSH, T4TOTAL, FREET4, T3FREE, THYROIDAB in the last 72 hours. Anemia Panel: No results for input(s): VITAMINB12, FOLATE, FERRITIN, TIBC, IRON, RETICCTPCT in the last 72 hours. Urine analysis:    Component Value Date/Time   COLORURINE YELLOW 05/14/2010 2150   APPEARANCEUR CLOUDY (A) 05/14/2010 2150   LABSPEC 1.031 (H) 05/14/2010 2150   PHURINE 6.0 05/14/2010 2150   GLUCOSEU NEGATIVE 05/14/2010 2150   HGBUR NEGATIVE 05/14/2010 2150   BILIRUBINUR NEGATIVE 05/14/2010 2150   KETONESUR NEGATIVE 05/14/2010 2150   PROTEINUR 100 (A) 05/14/2010 2150   UROBILINOGEN 0.2 05/14/2010 2150   NITRITE NEGATIVE 05/14/2010 2150   LEUKOCYTESUR NEGATIVE 05/14/2010 2150  Sepsis Labs: @LABRCNTIP (procalcitonin:4,lacticidven:4) ) Recent Results (from the past 240 hour(s))  Resp Panel by RT-PCR (Flu A&B, Covid) Nasopharyngeal Swab     Status: None   Collection Time: 03/15/21  6:59 AM   Specimen: Nasopharyngeal Swab; Nasopharyngeal(NP) swabs in vial transport medium  Result Value Ref Range Status   SARS Coronavirus 2 by RT PCR NEGATIVE NEGATIVE Final    Comment: (NOTE) SARS-CoV-2 target nucleic acids are NOT DETECTED.  The SARS-CoV-2 RNA is generally detectable in upper respiratory specimens during the acute phase of infection. The lowest concentration of SARS-CoV-2 viral copies this assay can detect is 138 copies/mL. A negative result does not preclude SARS-Cov-2 infection and should not be used as the sole basis for treatment or other patient management decisions. A negative result may occur with  improper specimen collection/handling, submission of specimen other than nasopharyngeal swab, presence of viral mutation(s) within the areas targeted by this assay, and inadequate number of viral copies(<138 copies/mL). A negative  result must be combined with clinical observations, patient history, and epidemiological information. The expected result is Negative.  Fact Sheet for Patients:  05/15/21  Fact Sheet for Healthcare Providers:  BloggerCourse.com  This test is no t yet approved or cleared by the [employer] FDA and  has been authorized for detection and/or diagnosis of SARS-CoV-2 by FDA under an Emergency Use Authorization (EUA). This EUA will remain  in effect (meaning this test can be used) for the duration of the COVID-19 declaration under Section 564(b)(1) of the Act, 21 U.S.C.section 360bbb-3(b)(1), unless the authorization is terminated  or revoked sooner.       Influenza A by PCR NEGATIVE NEGATIVE Final   Influenza B by PCR NEGATIVE NEGATIVE Final    Comment: (NOTE) The Xpert Xpress SARS-CoV-2/FLU/RSV plus assay is intended as an aid in the diagnosis of influenza from Nasopharyngeal swab specimens and should not be used as a sole basis for treatment. Nasal washings and aspirates are unacceptable for Xpert Xpress SARS-CoV-2/FLU/RSV testing.  Fact Sheet for Patients: [place]  Fact Sheet for Healthcare Providers: BloggerCourse.com  This test is not yet approved or cleared by the [employer] FDA and has been authorized for detection and/or diagnosis of SARS-CoV-2 by FDA under an Emergency Use Authorization (EUA). This EUA will remain in effect (meaning this test can be used) for the duration of the COVID-19 declaration under Section 564(b)(1) of the Act, 21 U.S.C. section 360bbb-3(b)(1), unless the authorization is terminated or revoked.  Performed at [facility], [address], [place], [place]      Radiological Exams on Admission: DG Chest Portable 1 View  Result Date: 03/15/2021 CLINICAL DATA:  35 year old male with chest pain for 3 days.  Code STEMI. EXAM: PORTABLE CHEST 1 VIEW COMPARISON:  Portable chest 06/03/2020 and earlier. FINDINGS: Portable AP semi upright view at 0711 hours. 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 Pacer/resuscitation pads project over the left chest. Mildly low lung volumes. Mediastinal contours remain normal. Allowing for portable technique the lungs are clear. No pneumothorax. No osseous abnormality identified. IMPRESSION: No acute cardiopulmonary abnormality. Chronic disconnected left subclavian pacer lead. Electronically Signed   By: 06/05/2020 M.D.   On: 03/15/2021 07:40     EKG: I have personally reviewed.  Sinus rhythm, QTC 410, ST elevation in V1-V3, T wave inversion in inferior leads and V4- V6   Assessment/Plan Principal Problem:   Chest pain of uncertain etiology Active Problems:   Hypertension   Chest pain of uncertain  etiology: Troponin level 7.  Patient has chronic ST elevation.  Urgent cardiac catheterization by Dr. Kirke Corin showed nearly normal coronary artery. His chest pain is slightly worse when he takes deep breath and holding his breath, indicating pleuritic chest pain nature.  D-dimer is negative, less likely to have PE.  - place to progressive unit for observation - Trend Trop - Repeat EKG in the am  - prn Nitroglycerin, fentanyl and aspirin - Risk factor stratification: will check FLP and A1C  - check UDS - 2d echo  Questionable hypertension: Hypertension is on his medical problem list, but patient denies history of hypertension.  His blood pressure is 118/93.  Patient is not taking medications currently. -IV hydralazine prn   DVT ppx: SQ Lovenox Code Status: Full code Family Communication: not done, no family member is at bed side.   Disposition Plan:  Anticipate discharge back to previous environment Consults called:  Dr. Kirke Corin Admission status and Level of care: Progressive Cardiac:   for obs    Status is: Observation  The patient remains OBS  appropriate and will d/c before 2 midnights.  Dispo: The patient is from: Home              Anticipated d/c is to: Home              Patient currently is not medically stable to d/c.   Difficult to place patient No          Date of Service 03/15/2021    Lorretta Harp [facility]   If 7PM-7AM, please contact night-coverage [email or web address] 03/15/2021, 8:57 AM

## 2021-03-15 NOTE — ED Provider Notes (Signed)
[facility] Emergency Department Provider Note  ____________________________________________   Event Date/Time   First MD Initiated Contact with Patient 03/15/21 [phone]     (approximate)  I have reviewed the triage vital signs and the nursing notes.   HISTORY  Chief Complaint Code STEMI   HPI Ricky Green is a 35 y.o. male with a past medical history of hypertension and previously smoker secondary to presumed cardiac syncope subsequently removed and evaluation last year for chest pain having undergone and unremarkable nuc med study in August 2021 who presents to the emergency room for assessment of proximately 3 days of worsening left-sided chest pressure described as tightness and sharp.  He states it is 8/10 intensity.  Is not clearly exertional or positional as it is present at rest.  He denies any nausea, vomiting, diarrhea or dysuria, rash,, back pain, abdominal pain, rash or recent injuries or falls.  He denies EtOH use, illicit drug use or tobacco abuse.  States this feels very similar to pain he has had in the past during his presentation in August.  No other acute concerns at this time.         Past Medical History:  Diagnosis Date  . Hypertension     Patient Active Problem List   Diagnosis Date Noted  . Atypical chest pain 06/04/2020  . ACS (acute coronary syndrome) (HCC) 06/03/2020    Past Surgical History:  Procedure Laterality Date  . pacemaker insertion and removal      Prior to Admission medications   Medication Sig Start Date End Date Taking? Authorizing Provider  aspirin 81 MG EC tablet Take by mouth.    [provider]  ibuprofen (ADVIL,MOTRIN) 200 MG tablet Take 400 mg by mouth every 6 (six) hours as needed.     [provider]    Allergies Hydrocodone  No family history on file.  Social History Social History   Tobacco Use  . Smoking status: Never Smoker  . Smokeless tobacco: Never Used  Substance  Use Topics  . Alcohol use: Yes    Comment: occasionally  . Drug use: No    Review of Systems  Review of Systems  Constitutional: Negative for chills and fever.  HENT: Negative for sore throat.   Eyes: Negative for pain.  Respiratory: Negative for cough and stridor.   Cardiovascular: Positive for chest pain.  Gastrointestinal: Negative for vomiting.  Genitourinary: Negative for dysuria.  Musculoskeletal: Negative for myalgias.  Skin: Negative for rash.  Neurological: Negative for seizures, loss of consciousness and headaches.  Psychiatric/Behavioral: Negative for suicidal ideas.  All other systems reviewed and are negative.     ____________________________________________   PHYSICAL EXAM:  VITAL SIGNS: ED Triage Vitals [03/15/21 0658]  Enc Vitals Group     BP      Pulse      Resp      Temp      Temp src      SpO2      Weight 164 lb (74.4 kg)     Height 5\' 5"  (1.651 m)     Head Circumference      Peak Flow      Pain Score 8     Pain Loc      Pain Edu?      Excl. in GC?    Vitals:   03/15/21 0720 03/15/21 0733  BP: (!) 118/93   Pulse: 60   Resp: (!) 21   Temp:    SpO2:  97% 95%   Physical Exam Vitals and nursing note reviewed.  Constitutional:      Appearance: He is well-developed.  HENT:     Head: Normocephalic and atraumatic.     Right Ear: External ear normal.     Left Ear: External ear normal.     Nose: Nose normal.  Eyes:     Conjunctiva/sclera: Conjunctivae normal.  Cardiovascular:     Rate and Rhythm: Normal rate and regular rhythm.     Pulses: Normal pulses.     Heart sounds: No murmur heard.   Pulmonary:     Effort: Pulmonary effort is normal. No respiratory distress.     Breath sounds: Normal breath sounds.  Abdominal:     Palpations: Abdomen is soft.     Tenderness: There is no abdominal tenderness.  Musculoskeletal:     Cervical back: Neck supple.  Skin:    General: Skin is warm and dry.     Capillary Refill: Capillary refill  takes less than 2 seconds.  Neurological:     Mental Status: He is alert and oriented to person, place, and time.  Psychiatric:        Mood and Affect: Mood normal.      ____________________________________________   LABS (all labs ordered are listed, but only abnormal results are displayed)  Labs Reviewed  COMPREHENSIVE METABOLIC PANEL - Abnormal; Notable for the following components:      Result Value   Glucose, Bld 113 (*)    Calcium 8.7 (*)    All other components within normal limits  RESP PANEL BY RT-PCR (FLU A&B, COVID) ARPGX2  CBC WITH DIFFERENTIAL/PLATELET  PROTIME-INR  TROPONIN I (HIGH SENSITIVITY)   ____________________________________________  EKG  Sinus bradycardia with ventricular to 57, normal axis, unremarkable intervals and ST elevations in V2 and V3 concerning for STEMI with ST depressions and T wave inversions in inferior lateral leads. ____________________________________________  RADIOLOGY  ED MD interpretation: No clear focal consolidation, pneumothorax, overt edema, large effusion or other clear acute intrathoracic process.  There is what appears to be a retained pacemaker wire.  Official radiology report(s): No results found.  ____________________________________________   PROCEDURES  Procedure(s) performed (including Critical Care):  .Critical Care Performed by: Gilles Chiquito, MD Authorized by: Gilles Chiquito, MD   Critical care provider statement:    Critical care time (minutes):  15   Critical care was necessary to treat or prevent imminent or life-threatening deterioration of the following conditions:  Cardiac failure   Critical care was time spent personally by me on the following activities:  Discussions with consultants, evaluation of patient's response to treatment, examination of patient, ordering and performing treatments and interventions, ordering and review of laboratory studies, ordering and review of radiographic studies,  pulse oximetry, re-evaluation of patient's condition, obtaining history from patient or surrogate and review of old charts     ____________________________________________   INITIAL IMPRESSION / ASSESSMENT AND PLAN / ED COURSE      Patient presents with above-stated history and exam for assessment of chest pain.  On arrival he is slightly bradycardic with otherwise stable vital signs on room air.  Given ECG concerning for STEMI he was made a code STEMI on arrival.  He was mainly given ASA fentanyl, nitro sublingual and heparin bolus.  CBC with no leukocytosis or acute anemia.  CMP without any significant electrode or metabolic derangements.  Troponin nonelevated 7  Patient taken emergently to Cath Lab by cardiology.  ____________________________________________   FINAL CLINICAL IMPRESSION(S) / ED DIAGNOSES  Final diagnoses:  ST elevation myocardial infarction (STEMI), unspecified artery (HCC)    Medications  nitroGLYCERIN (NITROSTAT) SL tablet 0.4 mg ( Sublingual MAR Hold 03/15/21 0731)  heparin ADULT infusion 100 units/mL (25000 units/221mL) (has no administration in time range)  aspirin chewable tablet 324 mg (324 mg Oral Given 03/15/21 0650)  fentaNYL (SUBLIMAZE) injection 50 mcg (50 mcg Intravenous Given 03/15/21 0705)  heparin sodium (porcine) injection 4,000 Units (4,000 Units Intravenous Given 03/15/21 0709)     ED Discharge Orders    None       Note:  This document was prepared using Dragon voice recognition software and may include unintentional dictation errors.   Gilles Chiquito, MD 03/15/21 [phone]

## 2021-03-15 NOTE — ED Notes (Signed)
X-ray at bedside

## 2021-03-15 NOTE — ED Notes (Signed)
Cardiologist at bedside.  

## 2021-03-15 NOTE — ED Notes (Signed)
Pt transported to cath lab with cardiologist and RN.

## 2021-03-15 NOTE — Discharge Summary (Signed)
Physician Discharge Summary  Ricky Green ZOX:[id number] DOB: 01/22/1986 DOA: 03/15/2021  PCP: Pcp, No  Admit date: 03/15/2021 Discharge date: 03/15/2021  Recommendations for Outpatient Follow-up:  1. Follow up with Dr. Gwen Pounds of cardiology in 1 weeks  Home Health: none Equipment/Devices: none    Discharge Condition: stable CODE STATUS: full Diet recommendation: Heart healthy diet  Brief/Interim Summary (HPI) Ricky Green is a 35 y.o. 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  Pt states that he is chest pain has been going on for more than 3 days.  It is located in the left side of the chest, 8 out of 10 in severity initially, currently 2 out of 10 severity, pressure, nonradiating.  Patient states that his chest pain is slightly worse when he takes deep breath and holding his breath.  No tenderness in the calf areas.  No long distant traveling recently.  He has some mild shortness of breath, no cough, fever or chills.  Denies nausea vomiting, diarrhea or abdominal pain.  No symptoms of UTI.   Today patient's EKG showed ST elevation in V1-V3.  Dr. Kary Kos of cardiology is consulted.  Patient underwent urgent cardiac catheterization, which showed nearly normal coronary artery.  Cardiac catheterization by Dr. Kirke Corin today:   Mid LAD lesion is 10% stenosed.  The left ventricular systolic function is normal.  LV end diastolic pressure is mildly elevated.  The left ventricular ejection fraction is 55-65% by visual estimate.  Of note, patient had chest pain last year and underwent unremarkable Myoview study in August 2021. Pt also had negative CTA for PE on 05/18/20  Myoview test 06/04/20:  Blood pressure demonstrated a normal response to exercise.  The study is normal.  This is a low risk study.  The left ventricular  ejection fraction is normal (55-65%).  ED Course: pt was found to have troponin level 7, INR 0.9, negative COVID-19 PCR, electrolytes renal function okay, temperature normal, blood pressure 118/93, heart rate 59, 65, RR 21, oxygen saturation 95% on room air.  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 Pt is placed on progressive bed for observation.   Subjective  -chest pain  Discharge Diagnoses and Green Course:   Principal Problem:   Chest pain of uncertain etiology Active Problems:   Hypertension    Chest pain of uncertain etiology: Troponin level 7.  Patient has chronic ST elevation.  Urgent cardiac catheterization by Dr. Kirke Corin showed nearly normal coronary artery. D-dimer is negative, less likely to have PE. 2d echo is normal. Per Dr. Kirke Corin, pt can be discharged home today and follow-up with Dr. Gwen Pounds in clinic  -continue Aspirin 81 mg daily -Follow-up with Dr. Gwen Pounds in 1 week  Questionable hypertension: Hypertension is on his medical problem list, but patient denies history of hypertension.  His blood pressure is 118/93.  Patient is not taking medications currently. -IV hydralazine prn in [place].   Discharge Instructions  Discharge Instructions    Call MD for:  difficulty breathing, headache or visual disturbances   Complete by: As directed    Call MD for:  severe uncontrolled pain   Complete by: As directed    Call MD for:  temperature >100.4   Complete by: As directed    Diet - low sodium heart healthy   Complete by: As directed    Increase  activity slowly   Complete by: As directed      Allergies as of 03/15/2021      Reactions   Hydrocodone Swelling   Swelling to face      Medication List    STOP taking these medications   ibuprofen 200 MG tablet Commonly known as: ADVIL     TAKE these medications   aspirin 81 MG EC tablet Take 1 tablet (81 mg total) by mouth daily. What changed:    how much to take  when to take this       Follow-up Information    Lamar Blinks, MD Follow up in 1 week(s).   Specialty: Cardiology Contact information: [address] [facility] [place] [address] [phone]              Allergies  Allergen Reactions  . Hydrocodone Swelling    Swelling to face    Consultations:  card   Procedures/Studies: CARDIAC CATHETERIZATION  Result Date: 03/15/2021  Mid LAD lesion is 10% stenosed.  The left ventricular systolic function is normal.  LV end diastolic pressure is mildly elevated.  The left ventricular ejection fraction is 50-55% by visual estimate.  1.  Minimal nonobstructive coronary artery disease. 2.  Low normal LV systolic function with mildly elevated left ventricular end-diastolic pressure. Recommendations: No culprit is identified for the patient's chest pain.  He does have significantly abnormal baseline EKG which seems to be chronic.  We will obtain an echocardiogram to make sure the patient does not have some form of hypertrophic cardiomyopathy.   DG Chest Portable 1 View  Result Date: 03/15/2021 CLINICAL DATA:  35 year old male with chest pain for 3 days. Code STEMI. EXAM: PORTABLE CHEST 1 VIEW COMPARISON:  Portable chest 06/03/2020 and earlier. FINDINGS: Portable AP semi upright view at 0711 hours. 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 Pacer/resuscitation pads project over the left chest. Mildly low lung volumes. Mediastinal contours remain normal. Allowing for portable technique the lungs are clear. No pneumothorax. No osseous abnormality identified. IMPRESSION: No acute cardiopulmonary abnormality. Chronic disconnected left subclavian pacer lead. Electronically Signed   By: Odessa Fleming M.D.   On: 03/15/2021 07:40   ECHOCARDIOGRAM COMPLETE  Result Date: 03/15/2021    ECHOCARDIOGRAM REPORT   Patient Name:   Ricky Green Date of Exam: 03/15/2021 Medical Rec #:   [id number]     Height:       65.0 in Accession #:    [id number]    Weight:       164.0 lb Date of Birth:  13-Feb-1986     BSA:          1.818 m Patient Age:    34 years      BP:           119/76 mmHg Patient Gender: M             HR:           58 bpm. Exam Location:  [facility] Procedure: 2D Echo, Color Doppler, Cardiac Doppler and Strain Analysis Indications:     Chest pain R07.9  History:         Patient has no prior history of Echocardiogram examinations.                  Risk Factors:Hypertension.  Sonographer:     Cristela Blue RDCS (AE) Referring Phys:  [id number] Few Aleta Man[place] Diagnosing Phys: Lorine Bears MD  Sonographer Comments: Global longitudinal strain was  attempted. IMPRESSIONS  1. Left ventricular ejection fraction, by estimation, is 55 to 60%. The left ventricle has normal function. The left ventricle has no regional wall motion abnormalities. Left ventricular diastolic parameters were normal. The global longitudinal strain is normal.  2. Right ventricular systolic function is normal. The right ventricular size is normal.  3. The mitral valve is normal in structure. Trivial mitral valve regurgitation. No evidence of mitral stenosis.  4. The aortic valve is normal in structure. Aortic valve regurgitation is not visualized. No aortic stenosis is present. FINDINGS  Left Ventricle: Left ventricular ejection fraction, by estimation, is 55 to 60%. The left ventricle has normal function. The left ventricle has no regional wall motion abnormalities. The global longitudinal strain is normal. The left ventricular internal cavity size was normal in size. There is no left ventricular hypertrophy. Left ventricular diastolic parameters were normal. Right Ventricle: The right ventricular size is normal. No increase in right ventricular wall thickness. Right ventricular systolic function is normal. Left Atrium: Left atrial size was normal in size. Right Atrium: Right atrial size was normal in size. Pericardium: There is no evidence of  pericardial effusion. Mitral Valve: The mitral valve is normal in structure. Trivial mitral valve regurgitation. No evidence of mitral valve stenosis. Tricuspid Valve: The tricuspid valve is normal in structure. Tricuspid valve regurgitation is not demonstrated. No evidence of tricuspid stenosis. Aortic Valve: The aortic valve is normal in structure. Aortic valve regurgitation is not visualized. No aortic stenosis is present. Aortic valve mean gradient measures 2.0 mmHg. Aortic valve peak gradient measures 3.7 mmHg. Aortic valve area, by VTI measures 2.62 cm. Pulmonic Valve: The pulmonic valve was normal in structure. Pulmonic valve regurgitation is not visualized. No evidence of pulmonic stenosis. Aorta: The aortic root is normal in size and structure. Venous: The inferior vena cava was not well visualized. IAS/Shunts: No atrial level shunt detected by color flow Doppler.  LEFT VENTRICLE PLAX 2D LVIDd:         4.48 cm  Diastology LVIDs:         2.89 cm  LV e' medial:    7.62 cm/s LV PW:         0.94 cm  LV E/e' medial:  8.5 LV IVS:        0.72 cm  LV e' lateral:   15.00 cm/s LVOT diam:     2.00 cm  LV E/e' lateral: 4.3 LV SV:         50 LV SV Index:   28 LVOT Area:     3.14 cm                          3D Volume EF:                         3D EF:        77 %                         LV EDV:       342 ml                         LV ESV:       78 ml                         LV SV:  264 ml RIGHT VENTRICLE RV Basal diam:  3.80 cm RV S prime:     12.00 cm/s TAPSE (M-mode): 4.3 cm LEFT ATRIUM             Index       RIGHT ATRIUM           Index LA diam:        4.00 cm 2.20 cm/m  RA Area:     15.80 cm LA Vol (A2C):   37.7 ml 20.74 ml/m RA Volume:   42.80 ml  23.54 ml/m LA Vol (A4C):   41.2 ml 22.66 ml/m LA Biplane Vol: 39.4 ml 21.67 ml/m  AORTIC VALVE                   PULMONIC VALVE AV Area (Vmax):    2.62 cm    PV Vmax:        0.69 m/s AV Area (Vmean):   2.68 cm    PV Peak grad:   1.9 mmHg AV Area (VTI):      2.62 cm    RVOT Peak grad: 3 mmHg AV Vmax:           96.15 cm/s AV Vmean:          70.600 cm/s AV VTI:            0.192 m AV Peak Grad:      3.7 mmHg AV Mean Grad:      2.0 mmHg LVOT Vmax:         80.30 cm/s LVOT Vmean:        60.300 cm/s LVOT VTI:          0.160 m LVOT/AV VTI ratio: 0.84  AORTA Ao Root diam: 3.20 cm MITRAL VALVE MV Area (PHT): 3.13 cm    SHUNTS MV Decel Time: 242 msec    Systemic VTI:  0.16 m MV E velocity: 64.70 cm/s  Systemic Diam: 2.00 cm MV A velocity: 51.00 cm/s MV E/A ratio:  1.27 Lorine Bears MD Electronically signed by Lorine Bears MD Signature Date/Time: 03/15/2021/3:46:33 PM    Final       Discharge Exam: Vitals:   03/15/21 1500 03/15/21 1548  BP: 130/72 120/73  Pulse: 67 66  Resp: 14 18  Temp:  97.6 F (36.4 C)  SpO2: 98% 99%   Vitals:   03/15/21 1400 03/15/21 1430 03/15/21 1500 03/15/21 1548  BP: 119/85 119/77 130/72 120/73  Pulse: 69 (!) 54 67 66  Resp: Temp:    97.6 F (36.4 C)  TempSrc:      SpO2: 98% 98% 98% 99%  Weight:      Height:        General: Not in acute distress HEENT:       Eyes: PERRL, EOMI, no scleral icterus.       ENT: No discharge from the ears and nose, no pharynx injection, no tonsillar enlargement.        Neck: No JVD, no bruit, no mass felt. Heme: No neck lymph node enlargement. Cardiac: S1/S2, RRR, No murmurs, No gallops or rubs. Respiratory: No rales, wheezing, rhonchi or rubs. GI: Soft, nondistended, nontender, no rebound pain, no organomegaly, BS present. GU: No hematuria Ext: No pitting leg edema bilaterally. 1+DP/PT pulse bilaterally. Musculoskeletal: No joint deformities, No joint redness or warmth, no limitation of ROM in spin. Skin: No rashes.  Neuro: Alert, oriented X3, cranial nerves II-XII grossly intact, moves all extremities normally.  Psych: Patient is not psychotic, no suicidal or hemocidal ideation.     The results of significant diagnostics from this hospitalization (including  imaging, microbiology, ancillary and laboratory) are listed below for reference.     Microbiology: Recent Results (from the past 240 hour(s))  Resp Panel by RT-PCR (Flu A&B, Covid) Nasopharyngeal Swab     Status: None   Collection Time: 03/15/21  6:59 AM   Specimen: Nasopharyngeal Swab; Nasopharyngeal(NP) swabs in vial transport medium  Result Value Ref Range Status   SARS Coronavirus 2 by RT PCR NEGATIVE NEGATIVE Final    Comment: (NOTE) SARS-CoV-2 target nucleic acids are NOT DETECTED.  The SARS-CoV-2 RNA is generally detectable in upper respiratory specimens during the acute phase of infection. The lowest concentration of SARS-CoV-2 viral copies this assay can detect is 138 copies/mL. A negative result does not preclude SARS-Cov-2 infection and should not be used as the sole basis for treatment or other patient management decisions. A negative result may occur with  improper specimen collection/handling, submission of specimen other than nasopharyngeal swab, presence of viral mutation(s) within the areas targeted by this assay, and inadequate number of viral copies(<138 copies/mL). A negative result must be combined with clinical observations, patient history, and epidemiological information. The expected result is Negative.  Fact Sheet for Patients:  BloggerCourse.com  Fact Sheet for Healthcare Providers:  SeriousBroker.it  This test is no t yet approved or cleared by the [place] FDA and  has been authorized for detection and/or diagnosis of SARS-CoV-2 by FDA under an Emergency Use Authorization (EUA). This EUA will remain  in effect (meaning this test can be used) for the duration of the COVID-19 declaration under Section 564(b)(1) of the Act, 21 U.S.C.section 360bbb-3(b)(1), unless the authorization is terminated  or revoked sooner.       Influenza A by PCR NEGATIVE NEGATIVE Final   Influenza B by PCR NEGATIVE  NEGATIVE Final    Comment: (NOTE) The Xpert Xpress SARS-CoV-2/FLU/RSV plus assay is intended as an aid in the diagnosis of influenza from Nasopharyngeal swab specimens and should not be used as a sole basis for treatment. Nasal washings and aspirates are unacceptable for Xpert Xpress SARS-CoV-2/FLU/RSV testing.  Fact Sheet for Patients: BloggerCourse.com  Fact Sheet for Healthcare Providers: SeriousBroker.it  This test is not yet approved or cleared by the [place] FDA and has been authorized for detection and/or diagnosis of SARS-CoV-2 by FDA under an Emergency Use Authorization (EUA). This EUA will remain in effect (meaning this test can be used) for the duration of the COVID-19 declaration under Section 564(b)(1) of the Act, 21 U.S.C. section 360bbb-3(b)(1), unless the authorization is terminated or revoked.  Performed at [facility], [address] [place], [place] [address]      Labs: BNP (last 3 results) No results for input(s): BNP in the last 8760 hours. Basic Metabolic Panel: Recent Labs  Lab 03/15/21 0658  NA 139  K 3.8  CL 106  CO2 25  GLUCOSE 113*  BUN 17  CREATININE 0.77  CALCIUM 8.7*   Liver Function Tests: Recent Labs  Lab 03/15/21 0658  AST 31  ALT 24  ALKPHOS 87  BILITOT 0.9  PROT 7.0  ALBUMIN 4.1   No results for input(s): LIPASE, AMYLASE in the last 168 hours. No results for input(s): AMMONIA in the last 168 hours. CBC: Recent Labs  Lab 03/15/21 0658  WBC 5.0  NEUTROABS 2.7  HGB 15.2  HCT 43.6  MCV 90.6  PLT 158  Cardiac Enzymes: No results for input(s): CKTOTAL, CKMB, CKMBINDEX, TROPONINI in the last 168 hours. BNP: Invalid input(s): POCBNP CBG: No results for input(s): GLUCAP in the last 168 hours. D-Dimer Recent Labs    03/15/21 0946  DDIMER 0.29   Hgb A1c No results for input(s): HGBA1C in the last 72 hours. Lipid Profile No results for input(s):  CHOL, HDL, LDLCALC, TRIG, CHOLHDL, LDLDIRECT in the last 72 hours. Thyroid function studies No results for input(s): TSH, T4TOTAL, T3FREE, THYROIDAB in the last 72 hours.  Invalid input(s): FREET3 Anemia work up No results for input(s): VITAMINB12, FOLATE, FERRITIN, TIBC, IRON, RETICCTPCT in the last 72 hours. Urinalysis    Component Value Date/Time   COLORURINE YELLOW 05/14/2010 2150   APPEARANCEUR CLOUDY (A) 05/14/2010 2150   LABSPEC 1.031 (H) 05/14/2010 2150   PHURINE 6.0 05/14/2010 2150   GLUCOSEU NEGATIVE 05/14/2010 2150   HGBUR NEGATIVE 05/14/2010 2150   BILIRUBINUR NEGATIVE 05/14/2010 2150   KETONESUR NEGATIVE 05/14/2010 2150   PROTEINUR 100 (A) 05/14/2010 2150   UROBILINOGEN 0.2 05/14/2010 2150   NITRITE NEGATIVE 05/14/2010 2150   LEUKOCYTESUR NEGATIVE 05/14/2010 2150   Sepsis Labs Invalid input(s): PROCALCITONIN,  WBC,  LACTICIDVEN Microbiology Recent Results (from the past 240 hour(s))  Resp Panel by RT-PCR (Flu A&B, Covid) Nasopharyngeal Swab     Status: None   Collection Time: 03/15/21  6:59 AM   Specimen: Nasopharyngeal Swab; Nasopharyngeal(NP) swabs in vial transport medium  Result Value Ref Range Status   SARS Coronavirus 2 by RT PCR NEGATIVE NEGATIVE Final    Comment: (NOTE) SARS-CoV-2 target nucleic acids are NOT DETECTED.  The SARS-CoV-2 RNA is generally detectable in upper respiratory specimens during the acute phase of infection. The lowest concentration of SARS-CoV-2 viral copies this assay can detect is 138 copies/mL. A negative result does not preclude SARS-Cov-2 infection and should not be used as the sole basis for treatment or other patient management decisions. A negative result may occur with  improper specimen collection/handling, submission of specimen other than nasopharyngeal swab, presence of viral mutation(s) within the areas targeted by this assay, and inadequate number of viral copies(<138 copies/mL). A negative result must be combined  with clinical observations, patient history, and epidemiological information. The expected result is Negative.  Fact Sheet for Patients:  BloggerCourse.com  Fact Sheet for Healthcare Providers:  SeriousBroker.it  This test is no t yet approved or cleared by the [place] FDA and  has been authorized for detection and/or diagnosis of SARS-CoV-2 by FDA under an Emergency Use Authorization (EUA). This EUA will remain  in effect (meaning this test can be used) for the duration of the COVID-19 declaration under Section 564(b)(1) of the Act, 21 U.S.C.section 360bbb-3(b)(1), unless the authorization is terminated  or revoked sooner.       Influenza A by PCR NEGATIVE NEGATIVE Final   Influenza B by PCR NEGATIVE NEGATIVE Final    Comment: (NOTE) The Xpert Xpress SARS-CoV-2/FLU/RSV plus assay is intended as an aid in the diagnosis of influenza from Nasopharyngeal swab specimens and should not be used as a sole basis for treatment. Nasal washings and aspirates are unacceptable for Xpert Xpress SARS-CoV-2/FLU/RSV testing.  Fact Sheet for Patients: BloggerCourse.com  Fact Sheet for Healthcare Providers: SeriousBroker.it  This test is not yet approved or cleared by the [place] FDA and has been authorized for detection and/or diagnosis of SARS-CoV-2 by FDA under an Emergency Use Authorization (EUA). This EUA will remain in effect (meaning this test can be used) for  the duration of the COVID-19 declaration under Section 564(b)(1) of the Act, 21 U.S.C. section 360bbb-3(b)(1), unless the authorization is terminated or revoked.  Performed at [facility], [address] [place], [place] [id number]     Time coordinating discharge:  25 minutes.   SIGNED:  Lorretta HarpXilin Nocholas Damaso, MD [facility] 03/15/2021, 5:32 PM   If 7PM-7AM, please contact  night-coverage [email or web address]

## 2021-03-15 NOTE — Consult Note (Signed)
Cardiology Consultation:   Green ID: Ricky Green MRN: [id number]; DOB: Dec 09, 1985  Admit date: 03/15/2021 Date of Consult: 03/15/2021  PCP:  Oneita Hurt No   [facility] Providers Cardiologist: Dr. Gwen Pounds   Green Profile:   Ricky Green is a 35 y.o. male with a hx of vasovagal syncope who is being seen 03/15/2021 for Ricky evaluation of chest pain and abnormal EKG at Ricky request of Dr. Katrinka Blazing.  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 He has history of an abnormal EKG with anterior ST elevation.  He actually presented last year in August with chest pain and abnormal EKG.  Initially, code STEMI was activated but after reviewing previous EKGs and Ricky fact that his symptoms were atypical, a code STEMI was canceled.  He had a nuclear stress test last year that was normal. He now presents with chest pain that started about 3 days ago.  He describes substernal tightness as well as sharp sensation in Ricky left chest area with some radiation to Ricky left arm.  Ricky pain is happening at rest.  No significant dyspnea or palpitations. He had an EKG done which showed evidence of anterior ST elevation with reciprocal changes in Ricky inferior leads. Given his continued symptoms and abnormal EKG, I recommended proceeding with cardiac catheterization.  I discussed Ricky procedure in details as well as risk and benefits.   Past Medical History:  Diagnosis Date  . Hypertension     Past Surgical History:  Procedure Laterality Date  . pacemaker insertion and removal       Home Medications:  Prior to Admission medications   Medication Sig Start Date End Date Taking? Authorizing Provider  aspirin 81 MG EC tablet Take by mouth.    [provider]  ibuprofen (ADVIL,MOTRIN) 200 MG tablet Take 400 mg by  mouth every 6 (six) hours as needed.     [provider]    Inpatient Medications: Scheduled Meds: . enoxaparin (LOVENOX) injection  40 mg Subcutaneous Q24H  . sodium chloride flush  3 mL Intravenous Q12H   Continuous Infusions: . sodium chloride    . sodium chloride     PRN Meds: sodium chloride, acetaminophen, albuterol, fentaNYL (SUBLIMAZE) injection, hydrALAZINE, ondansetron (ZOFRAN) IV, sodium chloride flush  Allergies:    Allergies  Allergen Reactions  . Hydrocodone Swelling    Swelling to face    Social History:   Social History   Socioeconomic History  . Marital status: Married    Spouse name: Not on file  . Number of children: Not on file  . Years of education: Not on file  . Highest education level: Not on file  Occupational History  . Not on file  Tobacco Use  . Smoking status: Never Smoker  . Smokeless tobacco: Never Used  Substance and Sexual Activity  . Alcohol use: Yes    Comment: occasionally  . Drug use: No  . Sexual activity: Not on file  Other Topics Concern  . Not on file  Social History Narrative  . Not on file   Social Determinants of Health   Financial Resource Strain: Not on file  Food Insecurity: Not on file  Transportation Needs: Not on file  Physical Activity: Not on file  Stress: Not on file  Social Connections: Not on file  Intimate Partner Violence:  Not on file    Family History:    Family History  Problem Relation Age of Onset  . Heart disease Maternal Grandmother      ROS:  Please see Ricky history of present illness.   All other ROS reviewed and negative.     Physical Exam/Data:   Vitals:   03/15/21 0900 03/15/21 0915 03/15/21 0930 03/15/21 1000  BP: 110/76 120/80 120/80 118/81  Pulse: 67 64 65 64  Resp: 20 18 (!) 21 19  Temp:      TempSrc:      SpO2: 98% 98% 99% 98%  Weight:      Height:       No intake or output data in Ricky 24 hours ending 03/15/21 1031 Last 3 Weights 03/15/2021 06/03/2020  05/18/2020  Weight (lbs) 164 lb 165 lb 165 lb  Weight (kg) 74.39 kg 74.844 kg 74.844 kg     Body mass index is 27.29 kg/m.  General:  Well nourished, well developed, in no acute distress HEENT: normal Lymph: no adenopathy Neck: no JVD Endocrine:  No thryomegaly Vascular: No carotid bruits; FA pulses 2+ bilaterally without bruits  Cardiac:  normal S1, S2; RRR; no murmur  Lungs:  clear to auscultation bilaterally, no wheezing, rhonchi or rales  Abd: soft, nontender, no hepatomegaly  Ext: no edema Musculoskeletal:  No deformities, BUE and BLE strength normal and equal Skin: warm and dry  Neuro:  CNs 2-12 intact, no focal abnormalities noted Psych:  Normal affect   EKG:  Ricky EKG was personally reviewed and demonstrates:   Telemetry:  Telemetry was personally reviewed and demonstrates: Sinus bradycardia with 1 to 2 mm anterior ST elevation with reciprocal ST depression in Ricky anterior leads  Relevant CV Studies:   Laboratory Data:  High Sensitivity Troponin:   Recent Labs  Lab 03/15/21 0658  TROPONINIHS 7     Chemistry Recent Labs  Lab 03/15/21 0658  NA 139  K 3.8  CL 106  CO2 25  GLUCOSE 113*  BUN 17  CREATININE 0.77  CALCIUM 8.7*  GFRNONAA >60  ANIONGAP 8    Recent Labs  Lab 03/15/21 0658  PROT 7.0  ALBUMIN 4.1  AST 31  ALT 24  ALKPHOS 87  BILITOT 0.9   Hematology Recent Labs  Lab 03/15/21 0658  WBC 5.0  RBC 4.81  HGB 15.2  HCT 43.6  MCV 90.6  MCH 31.6  MCHC 34.9  RDW 12.3  PLT 158   BNPNo results for input(s): BNP, PROBNP in Ricky last 168 hours.  DDimer  Recent Labs  Lab 03/15/21 0946  DDIMER 0.29     Radiology/Studies:  CARDIAC CATHETERIZATION  Result Date: 03/15/2021  Mid LAD lesion is 10% stenosed.  Ricky left ventricular systolic function is normal.  LV end diastolic pressure is mildly elevated.  Ricky left ventricular ejection fraction is 50-55% by visual estimate.  1.  Minimal nonobstructive coronary artery disease. 2.  Low normal  LV systolic function with mildly elevated left ventricular end-diastolic pressure. Recommendations: No culprit is identified for Ricky Green's chest pain.  He does have significantly abnormal baseline EKG which seems to be chronic.  We will obtain an echocardiogram to make sure Ricky Green does not have some form of hypertrophic cardiomyopathy.   DG Chest Portable 1 View  Result Date: 03/15/2021 CLINICAL DATA:  35 year old male with chest pain for 3 days. Code STEMI. EXAM: PORTABLE CHEST 1 VIEW COMPARISON:  Portable chest 06/03/2020 and earlier. FINDINGS: Portable AP semi upright  view at 0711 hours. 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 Pacer/resuscitation pads project over Ricky left chest. Mildly low lung volumes. Mediastinal contours remain normal. Allowing for portable technique Ricky lungs are clear. No pneumothorax. No osseous abnormality identified. IMPRESSION: No acute cardiopulmonary abnormality. Chronic disconnected left subclavian pacer lead. Electronically Signed   By: Odessa Fleming M.D.   On: 03/15/2021 07:40     Assessment and Plan:   1. Chest pain of unclear etiology: His initial EKG was worrisome for anterior STEMI and look slightly more pronounced than his prior EKG from last year.  Given continued symptoms, I elected to proceed with emergent cardiac catheterization which showed near normal coronary arteries with no evidence of obstructive disease.  No clear culprit is identified.  Based on this, Ricky Green does not appear to have significant coronary artery disease and thus does not require aspirin or a statin.  I agree with urine drug screen and D-dimer although Ricky suspicion for pulmonary embolism is overall low. 2. Abnormal EKG with evidence of anterior ST elevation with reciprocal ST depression in Ricky inferior leads: Given his relatively young age, I think we have to exclude hypertrophic cardiomyopathy.  I requested an echocardiogram for evaluation.  3. Ricky Green  can likely be discharged home after echocardiogram is done.  He should follow-up with Dr. Gwen Pounds as an outpatient.    For questions or updates, please contact [facility] Please consult [email or web address] for contact info under    Signed, Lorine Bears, MD  03/15/2021 10:31 AM

## 2021-03-15 NOTE — Progress Notes (Signed)
Pt arrived to the floor alert and oriented x 4. Right radial cath site dressing clean, dry and intact. Pt reports 2/10 chest pain. Oriented to call light and room. Bed in lowest position. Will continue to monitor.

## 2021-03-26 IMAGING — CT CT ANGIO CHEST
2 of 6 series · 18 of 46 positions shown · IV contrast (APPLIED)
Comparison: Chest radiograph dated 05/18/2020.

CLINICAL DATA: 34-year-old male with concern for pulmonary
embolism.

EXAM:
CT ANGIOGRAPHY CHEST WITH CONTRAST
TECHNIQUE: Multidetector CT imaging of the chest was performed using the
standard protocol during bolus administration of intravenous
contrast. Multiplanar CT image reconstructions and MIPs were
obtained to evaluate the vascular anatomy.
CONTRAST:  75mL OMNIPAQUE IOHEXOL 350 MG/ML SOLN

[Series 5: thins · axial · 0.76mm/px · z∈[-679,-450]mm · 16 of 251 slices shown]
[im 11/251  lung]
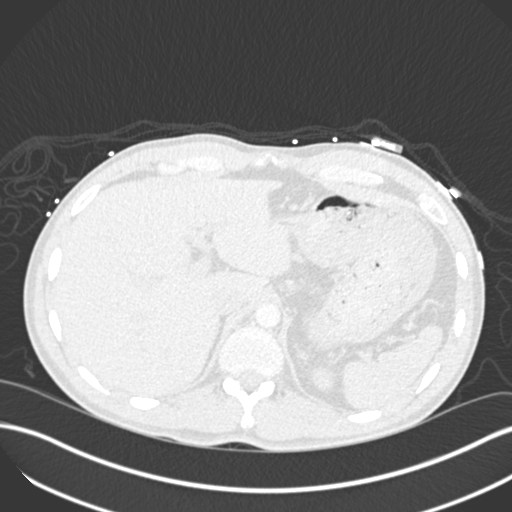
[im 33/251  soft-tissue]
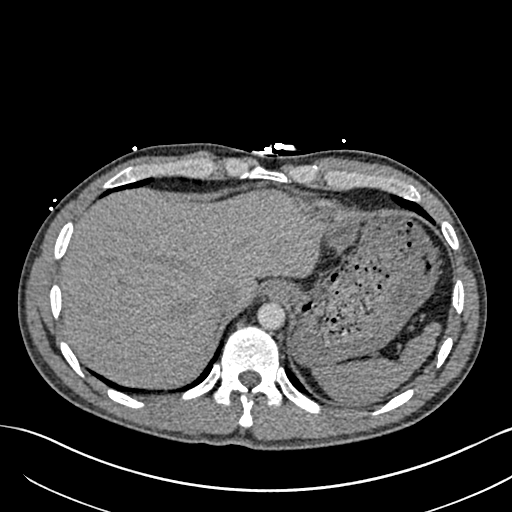
[im 44/251  lung]
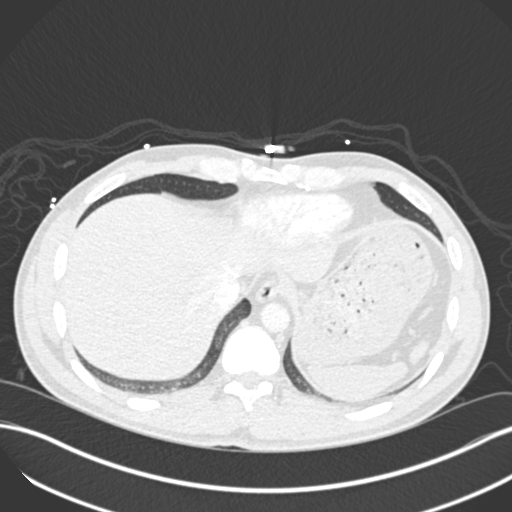
[im 55/251  soft-tissue]
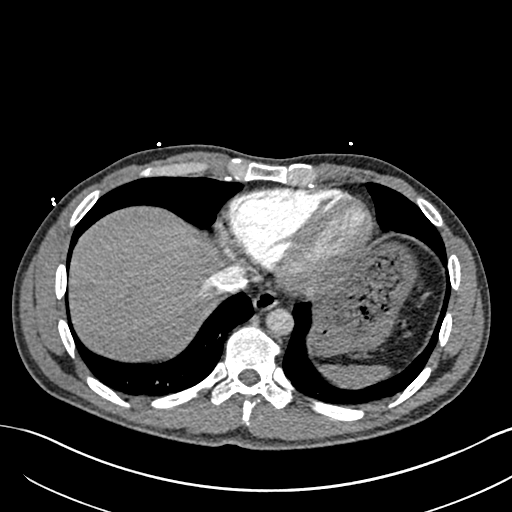
[im 77/251  lung]
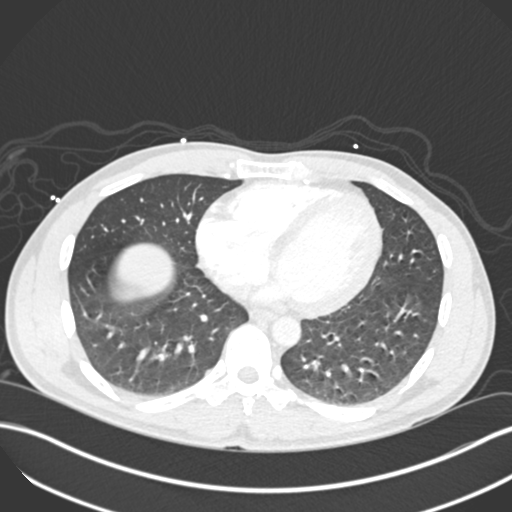
[im 87/251  soft-tissue]
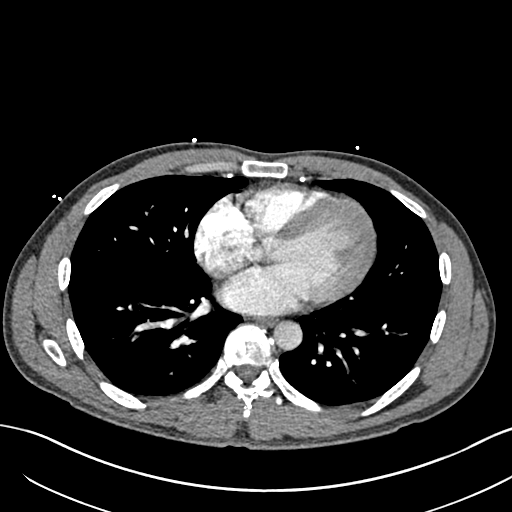
[im 98/251  lung]
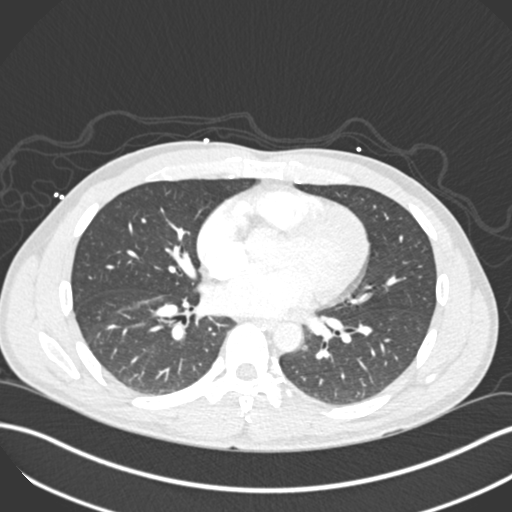
[im 120/251  soft-tissue]
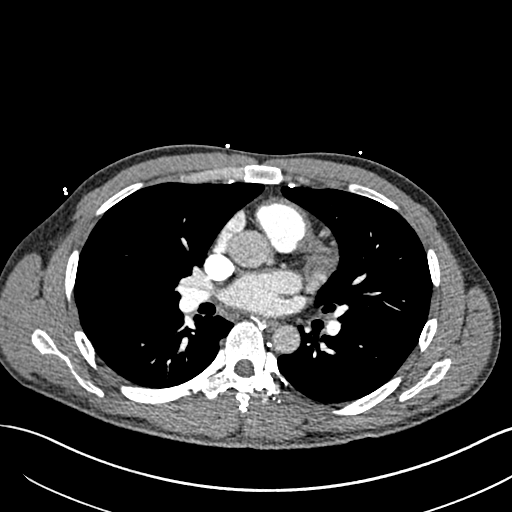
[im 131/251  lung]
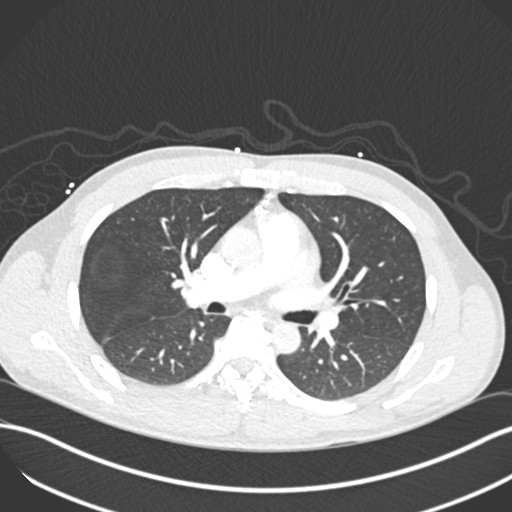
[im 153/251  soft-tissue]
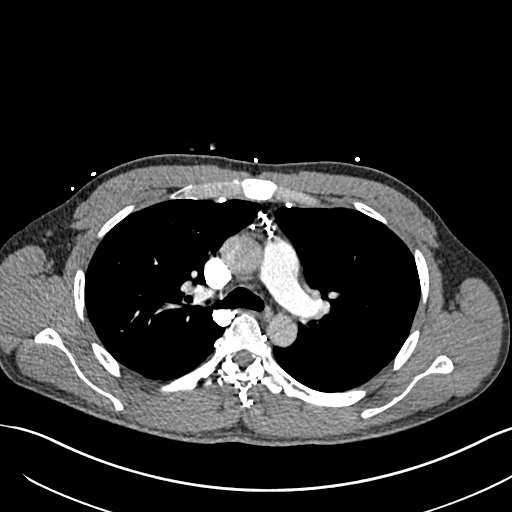
[im 164/251  lung]
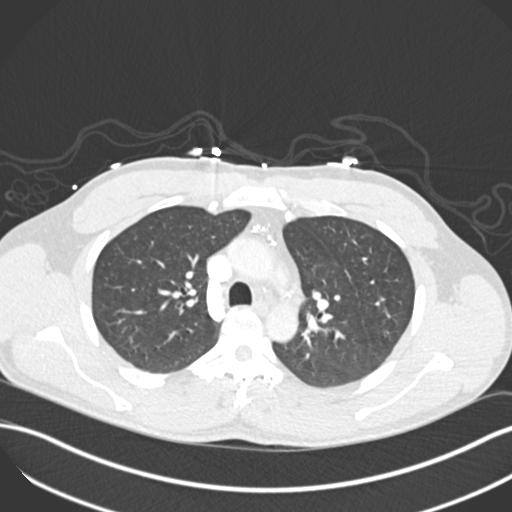
[im 174/251  soft-tissue]
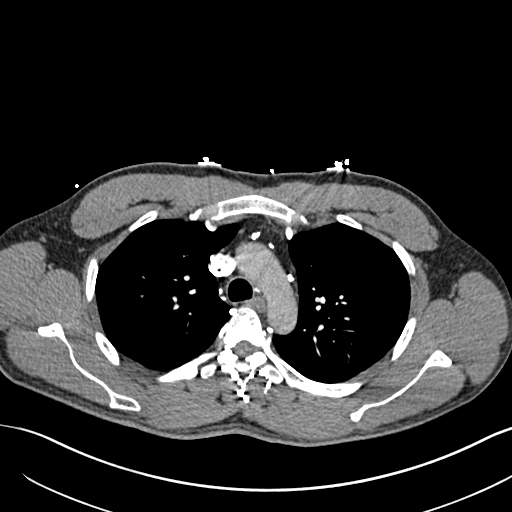
[im 196/251  lung]
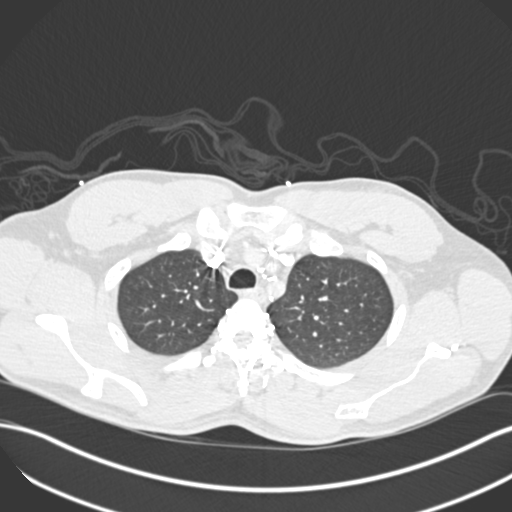
[im 207/251  soft-tissue]
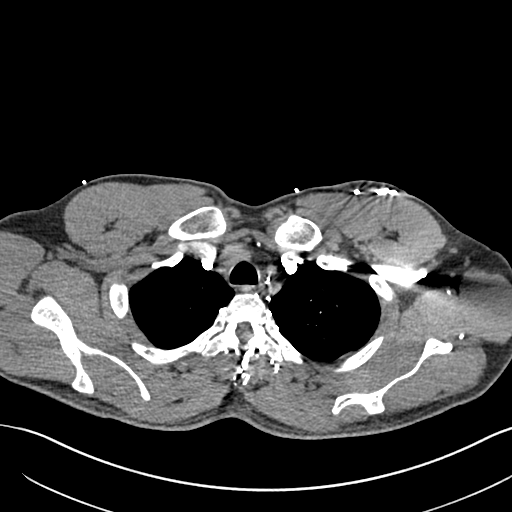
[im 218/251  lung]
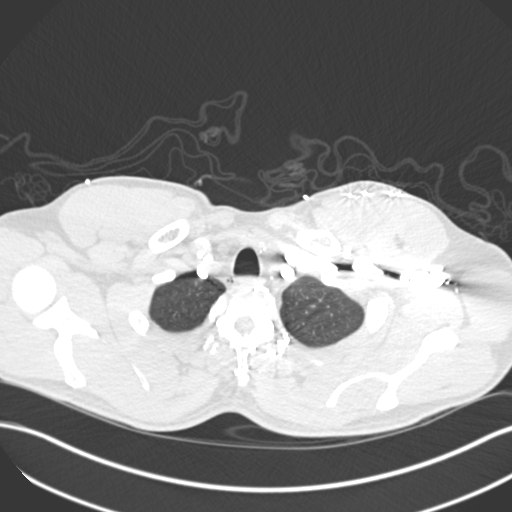
[im 240/251  soft-tissue]
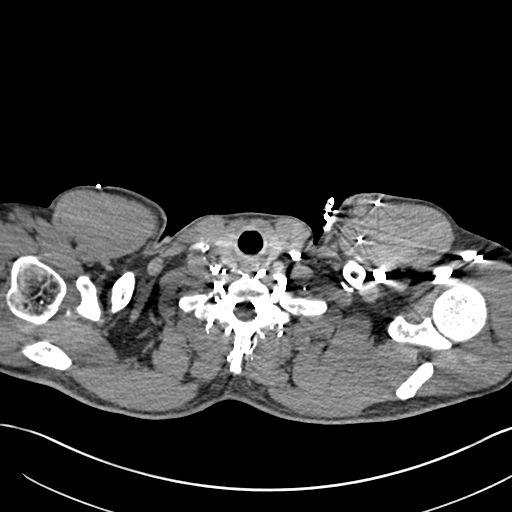

[Series 7: coronal mpr · coronal · 0.49mm/px · 2 of 80 slices shown]
[im 27/80  soft-tissue]
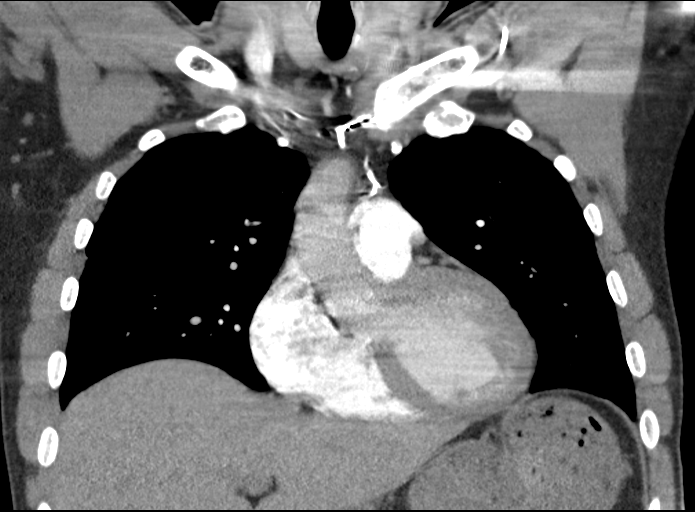
[im 53/80  soft-tissue]
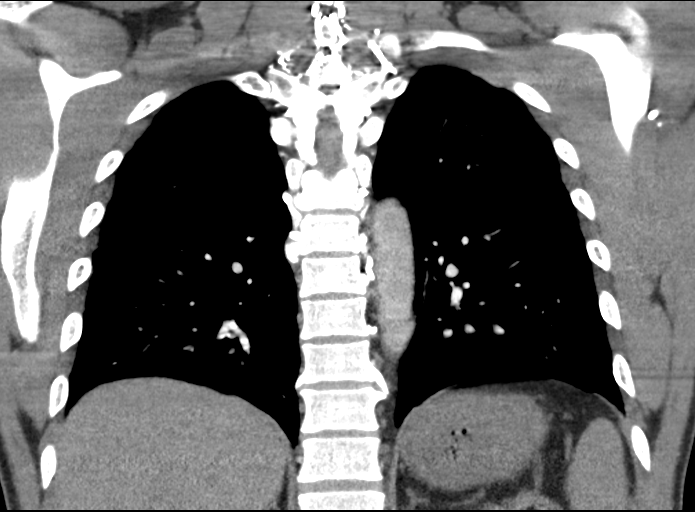

[18 of 46 positions shown; findings below may reference images not displayed]

FINDINGS: Cardiovascular: There is no cardiomegaly or pericardial effusion.
The thoracic aorta is unremarkable. Evaluation of the pulmonary
arteries is limited due to suboptimal opacification as well as
secondary to respiratory motion artifact. No pulmonary artery
embolus identified.

Mediastinum/Nodes: There is no hilar or mediastinal adenopathy. The
esophagus is grossly unremarkable. No mediastinal fluid collection.

Lungs/Pleura: The lungs are clear. There is no pleural effusion
pneumothorax. The central airways are patent.

Upper Abdomen: No acute abnormality.

Musculoskeletal: No chest wall abnormality. No acute or significant
osseous findings.

Review of the MIP images confirms the above findings.
IMPRESSION: No acute intrathoracic pathology. No CT evidence of pulmonary
embolism.

## 2021-04-11 IMAGING — DX DG CHEST 1V PORT
1 series · 1 of 1 positions shown · non-contrast
Comparison: 05/18/2020

CLINICAL DATA: Left-sided chest pain for several hours

EXAM:
PORTABLE CHEST 1 VIEW

[chest ap]
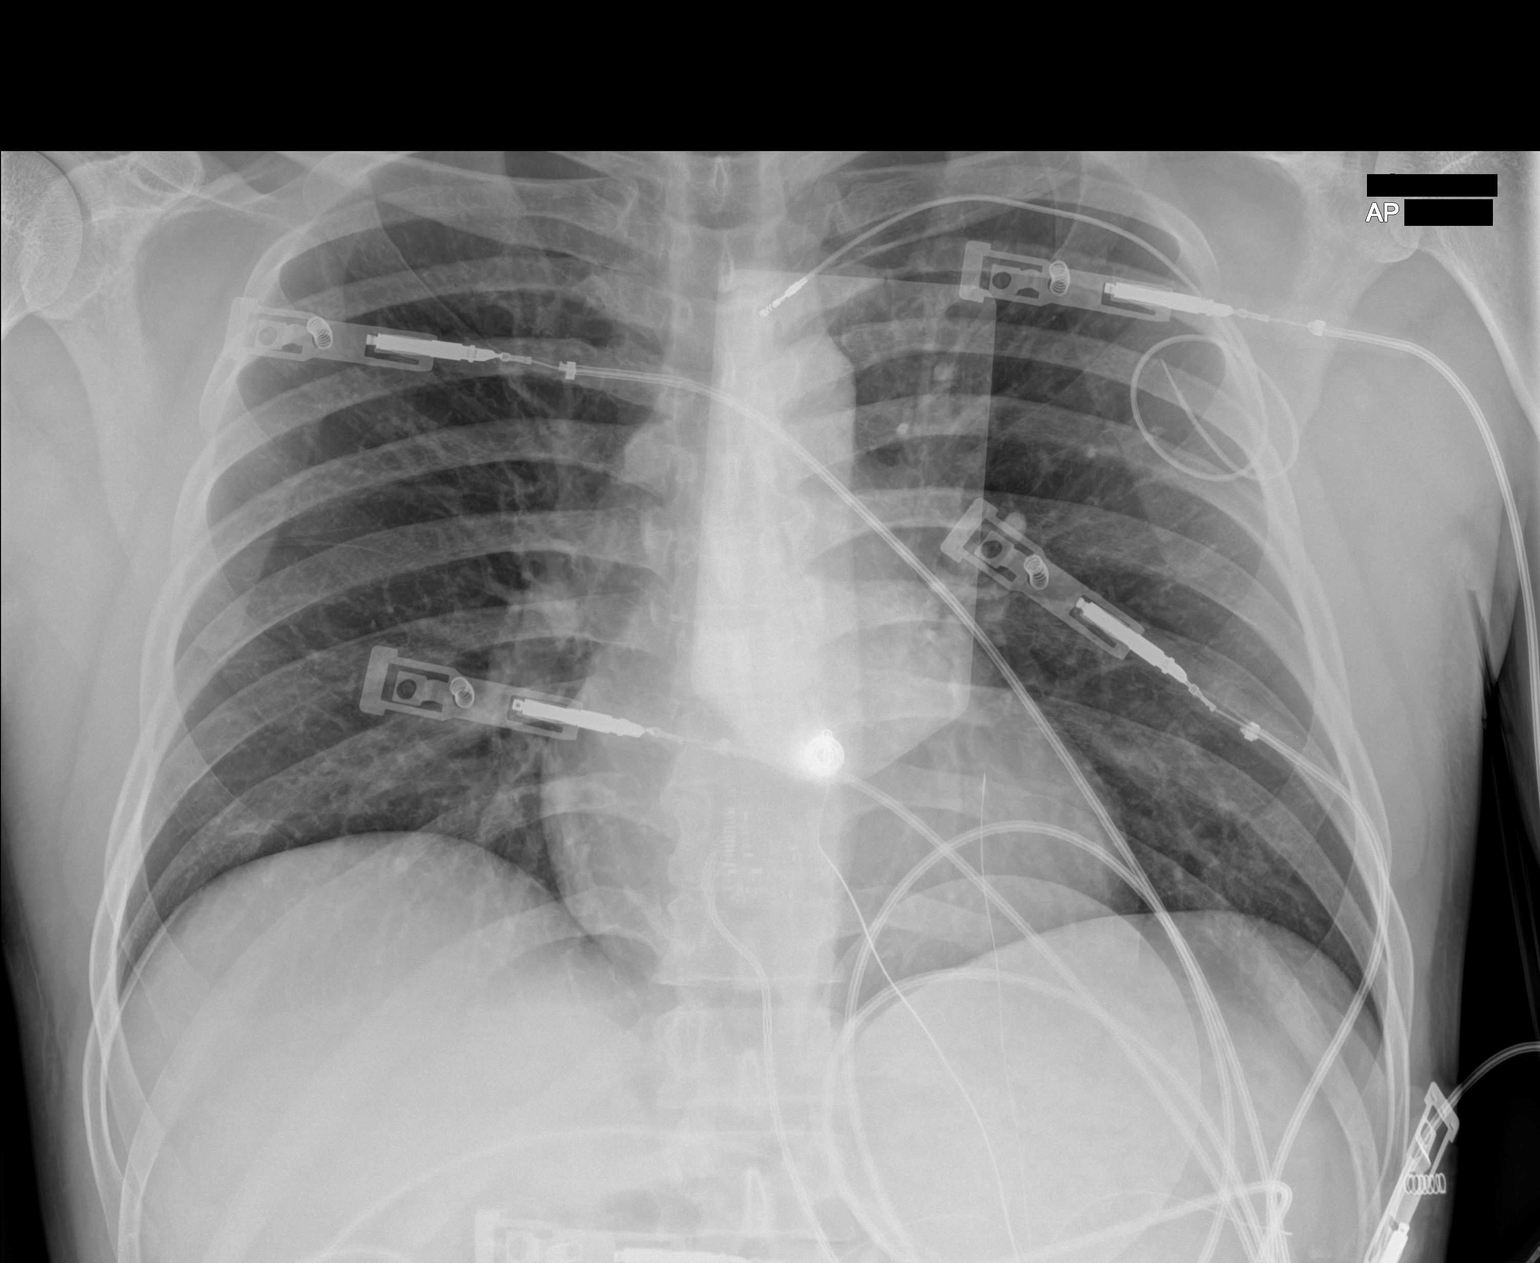

[1 of 1 positions shown; findings below may reference images not displayed]

FINDINGS: Cardiac shadow is within normal limits. The lungs are well aerated
bilaterally. Old pacing lead is noted over the left chest stable
from the prior study. No focal infiltrate is seen. No bony
abnormality is noted.
IMPRESSION: No active disease.

## 2022-02-05 ENCOUNTER — Other Ambulatory Visit: Payer: Self-pay

## 2022-02-05 ENCOUNTER — Emergency Department
Admission: EM | Admit: 2022-02-05 | Discharge: 2022-02-05 | Disposition: A | Discharge status: Home / Self Care | Payer: Self-pay | Attending: Emergency Medicine | Admitting: Emergency Medicine

## 2022-02-05 ENCOUNTER — Encounter: Payer: Self-pay | Admitting: Emergency Medicine

## 2022-02-05 DIAGNOSIS — N50812 Left testicular pain: Secondary | ICD-10-CM | POA: Insufficient documentation

## 2022-02-05 DIAGNOSIS — N50811 Right testicular pain: Secondary | ICD-10-CM | POA: Insufficient documentation

## 2022-02-05 DIAGNOSIS — N4889 Other specified disorders of penis: Secondary | ICD-10-CM | POA: Insufficient documentation

## 2022-02-05 DIAGNOSIS — L237 Allergic contact dermatitis due to plants, except food: Secondary | ICD-10-CM | POA: Insufficient documentation

## 2022-02-05 MED ORDER — METHYLPREDNISOLONE SODIUM SUCC 125 MG IJ SOLR
125.0000 mg | Freq: Once | INTRAMUSCULAR | Status: AC
  Administered 2022-02-05: 125 mg via INTRAMUSCULAR
  Filled 2022-02-05: qty 2

## 2022-02-05 MED ORDER — PREDNISONE 10 MG PO TABS: ORAL_TABLET | ORAL | 0 refills | Status: DC

## 2022-02-05 MED ORDER — DIPHENHYDRAMINE HCL 25 MG PO CAPS
50.0000 mg | ORAL_CAPSULE | Freq: Once | ORAL | Status: AC
  Administered 2022-02-05: 50 mg via ORAL
  Filled 2022-02-05: qty 2

## 2022-02-05 NOTE — ED Notes (Signed)
See triage note   presents with poison ivy to arms,leg and groin ?

## 2022-02-05 NOTE — ED Provider Notes (Signed)
? ?[facility] ?Provider Note ? ? ? Event Date/Time  ? First MD Initiated Contact with Patient 02/05/22 1107   ?  (approximate) ? ? ?History  ? ?Poison Ivy ? ? ?HPI ? ?Ricky Green is a 36 y.o. male presents to the ER today with complaint of a rash.  He noticed this 2 days ago.  The rash started out on his arms and has now spread to his abdomen and genital area.  He reports associated swelling of his penis and testicles.  He is able to urinate.  The rash itches and burns.  He was working out in the yard prior to the onset of the rash.  He reports severe reaction to poison ivy in the past and thinks this is the same.  He has not tried anything OTC for this. ? ? ?Physical Exam  ? ?Triage Vital Signs: ?ED Triage Vitals  ?Enc Vitals Group  ?   BP 02/05/22 1103 (!) 134/95  ?   Pulse Rate 02/05/22 1103 86  ?   Resp 02/05/22 1103 16  ?   Temp 02/05/22 1103 98.9 ?F (37.2 ?C)  ?   Temp Source 02/05/22 1103 Oral  ?   SpO2 02/05/22 1103 96 %  ?   Weight 02/05/22 1102 165 lb 5.5 oz (75 kg)  ?   Height 02/05/22 1102 5\' 5"  (1.651 m)  ?   Head Circumference --   ?   Peak Flow --   ?   Pain Score 02/05/22 1102 0  ?   Pain Loc --   ?   Pain Edu? --   ?   Excl. in [place]? --   ? ? ?Most recent vital signs: ?Vitals:  ? 02/05/22 1103  ?BP: (!) 134/95  ?Pulse: 86  ?Resp: 16  ?Temp: 98.9 ?F (37.2 ?C)  ?SpO2: 96%  ? ? ? ?General: Awake, no distress.  ?CV:  RRR. ?Resp:  Normal effort.  ?GU:  1+ swelling noted of the penis and scrotum. ?Skin:  Scattered, scabbed vesicular lesions in a linear pattern noted of bilateral arms and abdomen.  Similar appearing lesions noted at the tip of the penis and scrotum. ? ? ?ED Results / Procedures / Treatments  ? ? ? ?MEDICATIONS ORDERED IN ED: ?Medications  ?methylPREDNISolone sodium succinate (SOLU-MEDROL) 125 mg/2 mL injection 125 mg (125 mg Intramuscular Given 02/05/22 1123)  ?diphenhydrAMINE (BENADRYL) capsule 50 mg (50 mg Oral Given 02/05/22 1123)  ? ? ? ?IMPRESSION / MDM /  ASSESSMENT AND PLAN / ED COURSE  ?I reviewed the triage vital signs and the nursing notes. ? ?Rash ? ?Differential diagnosis includes, but is not limited to, allergic reaction, contact dermatitis due to plant, contact dermatitis due to other agents ? ?Solumedrol 125 mg IM x 1 ?Benadryl 50 mg PO x 1 ?RX for Pred Taper x 9 days ?Ok to take Benadryl 25 mg every 8 hours ?Ok to use Calamine lotion and Oatmeal baths as needed ? ? ?FINAL CLINICAL IMPRESSION(S) / ED DIAGNOSES  ? ?Final diagnoses:  ?Poison ivy  ? ? ? ?Rx / DC Orders  ? ?ED Discharge Orders   ? ?      Ordered  ?  predniSONE (DELTASONE) 10 MG tablet       ? 02/05/22 1144  ? ?  ?  ? ?  ? ? ? ?Note:  This document was prepared using Dragon voice recognition software and may include unintentional dictation errors. ? ?  ?02/07/22,  NP ?02/05/22 1149 ? ?  ?Merwyn Katos, MD ?02/05/22 1259 ? ?

## 2022-02-05 NOTE — Discharge Instructions (Signed)
You were seen today for a rash. This is consistent with Poison ivy. You have been given steroids to take for 9 days. You may also take Benadryl 25 mg every 8 hours. Calamine lotion and oatmeal baths may also be helpful. ?

## 2022-02-05 NOTE — ED Triage Notes (Signed)
Pt reports got poison ivy 2 days ago and it has spread all over now and is on his penis.  ?

## 2022-05-24 ENCOUNTER — Emergency Department: Payer: Self-pay

## 2022-05-24 ENCOUNTER — Other Ambulatory Visit: Payer: Self-pay

## 2022-05-24 ENCOUNTER — Emergency Department
Admission: EM | Admit: 2022-05-24 | Discharge: 2022-05-24 | Disposition: A | Discharge status: Home / Self Care | Payer: Self-pay | Attending: Emergency Medicine | Admitting: Emergency Medicine

## 2022-05-24 ENCOUNTER — Encounter: Payer: Self-pay | Admitting: Intensive Care

## 2022-05-24 DIAGNOSIS — R079 Chest pain, unspecified: Secondary | ICD-10-CM | POA: Insufficient documentation

## 2022-05-24 DIAGNOSIS — R55 Syncope and collapse: Secondary | ICD-10-CM | POA: Insufficient documentation

## 2022-05-24 DIAGNOSIS — I10 Essential (primary) hypertension: Secondary | ICD-10-CM | POA: Insufficient documentation

## 2022-05-24 LAB — BASIC METABOLIC PANEL
Anion gap: 8 (ref 5–15)
BUN: 11 mg/dL (ref 6–20)
CO2: 24 mmol/L (ref 22–32)
Calcium: 8.8 mg/dL — ABNORMAL LOW (ref 8.9–10.3)
Chloride: 109 mmol/L (ref 98–111)
Creatinine, Ser: 0.64 mg/dL (ref 0.61–1.24)
GFR, Estimated: >60 mL/min (ref >60)
Glucose, Bld: 106 mg/dL — ABNORMAL HIGH (ref 70–99)
Potassium: 3.7 mmol/L (ref 3.5–5.1)
Sodium: 141 mmol/L (ref 135–145)

## 2022-05-24 LAB — CBC
HCT: 43.7 % (ref 39.0–52.0)
Hemoglobin: 14.9 g/dL (ref 13.0–17.0)
MCH: 31.4 pg (ref 26.0–34.0)
MCHC: 34.1 g/dL (ref 30.0–36.0)
MCV: 92.2 fL (ref 80.0–100.0)
Platelets: 188 10*3/uL (ref 150–400)
RBC: 4.74 MIL/uL (ref 4.22–5.81)
RDW: 12.1 % (ref 11.5–15.5)
WBC: 5 10*3/uL (ref 4.0–10.5)
nRBC: 0 % (ref 0.0–0.2)

## 2022-05-24 LAB — TROPONIN I (HIGH SENSITIVITY): Troponin I (High Sensitivity): 4 ng/L (ref <18)

## 2022-05-24 MED ORDER — NAPROXEN 500 MG PO TABS: 500.0000 mg | ORAL_TABLET | Freq: Two times a day (BID) | ORAL | 0 refills | Status: AC

## 2022-05-24 MED ORDER — KETOROLAC TROMETHAMINE 30 MG/ML IJ SOLN
30.0000 mg | Freq: Once | INTRAMUSCULAR | Status: AC
  Administered 2022-05-24: 30 mg via INTRAMUSCULAR
  Filled 2022-05-24: qty 1

## 2022-05-24 NOTE — Discharge Instructions (Signed)
Follow-up with your primary care provider if any continued problems.  If you do not have a primary care provider you may also [facility].  The naproxen is 500 mg twice daily with food.  A good Rx card was given to you to help with the price of this medication which should be generic.  Return to the emergency department if you experience any severe worsening of your chest pain, shortness of breath or difficulty breathing.

## 2022-05-24 NOTE — ED Notes (Signed)
See triage note  Presents with pain to left side of chest for about 5 days  states pain has been intermittent   increased with inspiration  Denies any fever,cough or trauma

## 2022-05-24 NOTE — ED Provider Notes (Signed)
[facility] Provider Note    Event Date/Time   First MD Initiated Contact with Patient 05/24/22 [phone]     (approximate)   History   Chest Pain   HPI  Curtiss Mahmood is a 36 y.o. male   Libby Maw to the ED with complaint of left-sided chest pain for the last 5 days that has progressively gotten worse.  Patient states he took Tylenol with no relief of his pain.  He denies any injury to his chest.  Patient states that in the past he has had a pacemaker which was removed 7 years ago.  He is followed by cardiology at [facility] and denies any syncopal episodes and they have decided to discontinue the pacemaker.  Patient denies any shortness of breath or difficulty breathing, dizziness, diaphoresis or feeling of indigestion.  He has a history of ACS, atypical chest pain, hypertension.      Physical Exam   Triage Vital Signs: ED Triage Vitals  Enc Vitals Group     BP 05/24/22 0723 (!) 123/90     Pulse Rate 05/24/22 0723 72     Resp 05/24/22 0723 16     Temp 05/24/22 0723 98.4 F (36.9 C)     Temp Source 05/24/22 0723 Oral     SpO2 05/24/22 0723 96 %     Weight 05/24/22 0715 165 lb (74.8 kg)     Height 05/24/22 0715 5' 5"  (1.651 m)     Head Circumference --      Peak Flow --      Pain Score 05/24/22 0715 10     Pain Loc --      Pain Edu? --      Excl. in [place]? --     Most recent vital signs: Vitals:   05/24/22 1100 05/24/22 1126  BP: 122/80 120/78  Pulse: 70 72  Resp: 16 18  Temp:  98 F (36.7 C)  SpO2: 98% 98%     General: Awake, no distress.  CV:  Good peripheral perfusion.  Heart regular rate and rhythm. Resp:  Normal effort.  Lungs are clear bilaterally. Abd:  No distention.  Other:  Left anterior chest wall tenderness on palpation and on exertion of the left shoulder.  No skin discoloration or warmth is noted.   ED Results / Procedures / Treatments   Labs (all labs ordered are listed, but only abnormal results are displayed) Labs  Reviewed  BASIC METABOLIC PANEL - Abnormal; Notable for the following components:      Result Value   Glucose, Bld 106 (*)    Calcium 8.8 (*)    All other components within normal limits  CBC  TROPONIN I (HIGH SENSITIVITY)  TROPONIN I (HIGH SENSITIVITY)     EKG Normal sinus rhythm Vent. rate 71 BPM PR interval 132 ms QRS duration 84 ms QT/QTcB 370/402 ms P-R-T axes 56 55 -36   RADIOLOGY Chest x-ray images were reviewed and interpreted as no acute cardio pulmonary disease.    PROCEDURES:  Critical Care performed:   Procedures   MEDICATIONS ORDERED IN ED: Medications  ketorolac (TORADOL) 30 MG/ML injection 30 mg (30 mg Intramuscular Given 05/24/22 0952)     IMPRESSION / MDM / ASSESSMENT AND PLAN / ED COURSE  I reviewed the triage vital signs and the nursing notes.   Differential diagnosis includes, but is not limited to, atypical chest wall pain, acute cardiac event, muscle skeletal pain, muscle skeletal strain.  He 67-year-old male presents  to the ED with history of left-sided chest wall pain for 1 week without history of injury.  Troponin was reassuring at 4, met be was basically within normal limits as a nonfasting test and CBC normal.  EKG was reviewed and chest x-ray showed no acute cardiopulmonary disease.  After reviewing patient's results an injection of Toradol was given IM.  Patient began having relief of his chest wall pain and was feeling much better prior to discharge.  We discussed continuing with naproxen 500 mg twice daily with food and also follow-up with Dr. Nehemiah Massed who is seeing at [facility] cardiology.  His return to the emergency department if any changes in his symptoms or condition.      Patient's presentation is most consistent with acute presentation with potential threat to life or bodily function.  FINAL CLINICAL IMPRESSION(S) / ED DIAGNOSES   Final diagnoses:  Nonspecific chest pain     Rx / DC Orders   ED Discharge Orders           Ordered    naproxen (NAPROSYN) 500 MG tablet  2 times daily with meals        05/24/22 1108             Note:  This document was prepared using Dragon voice recognition software and may include unintentional dictation errors.   Johnn Hai, PA-C 05/24/22 1543    Carrie Mew, MD 05/26/22 [phone]

## 2022-05-24 NOTE — ED Triage Notes (Signed)
Patient c/o sharp left sided chest pain X5 days that has progressively gotten worse.

## 2022-12-23 ENCOUNTER — Other Ambulatory Visit: Payer: Self-pay

## 2022-12-23 ENCOUNTER — Encounter: Payer: Self-pay | Admitting: Emergency Medicine

## 2022-12-23 ENCOUNTER — Emergency Department
Admission: EM | Admit: 2022-12-23 | Discharge: 2022-12-23 | Disposition: A | Discharge status: Home / Self Care | Payer: Self-pay | Attending: Emergency Medicine | Admitting: Emergency Medicine

## 2022-12-23 DIAGNOSIS — L237 Allergic contact dermatitis due to plants, except food: Secondary | ICD-10-CM | POA: Insufficient documentation

## 2022-12-23 MED ORDER — MUPIROCIN 2 % EX OINT: TOPICAL_OINTMENT | CUTANEOUS | 0 refills | Status: AC

## 2022-12-23 MED ORDER — DEXAMETHASONE SODIUM PHOSPHATE 10 MG/ML IJ SOLN
10.0000 mg | Freq: Once | INTRAMUSCULAR | Status: AC
  Administered 2022-12-23: 10 mg via INTRAMUSCULAR
  Filled 2022-12-23: qty 1

## 2022-12-23 MED ORDER — PREDNISONE 5 MG (48) PO TBPK: ORAL_TABLET | ORAL | 0 refills | Status: AC

## 2022-12-23 MED ORDER — FAMOTIDINE 20 MG PO TABS
40.0000 mg | ORAL_TABLET | Freq: Once | ORAL | Status: AC
  Administered 2022-12-23: 40 mg via ORAL
  Filled 2022-12-23: qty 2

## 2022-12-23 MED ORDER — FAMOTIDINE 20 MG PO TABS: 20.0000 mg | ORAL_TABLET | Freq: Two times a day (BID) | ORAL | 0 refills | Status: AC

## 2022-12-23 NOTE — ED Provider Notes (Signed)
[facility] Emergency Department Provider Note     Event Date/Time   First MD Initiated Contact with Patient 12/23/22 2202     (approximate)   History   Poison Ivy   HPI  Ricky Green is a 37 y.o. male with a noncontributory medical history, presents to the ED for evaluation of itchy rash to the face, extremities, torso, and groin.  Patient reports he was doing some brush clearing at home about 2 days ago, and today ago developed some itchy rash that is quickly spread.  He reports previous exposure to poison ivy and treatment with a steroid injection.  He took Benadryl prior to arrival, and presents with a pruritic rash.  No other fevers, chills, chest pain, or shortness of breath reported.   Physical Exam   Triage Vital Signs: ED Triage Vitals [12/23/22 2151]  Enc Vitals Group     BP 132/78     Pulse Rate 69     Resp 18     Temp 98.1 F (36.7 C)     Temp Source Oral     SpO2 96 %     Weight 165 lb (74.8 kg)     Height 5\' 5"  (1.651 m)     Head Circumference      Peak Flow      Pain Score 0     Pain Loc      Pain Edu?      Excl. in [place]?     Most recent vital signs: Vitals:   12/23/22 2151  BP: 132/78  Pulse: 69  Resp: 18  Temp: 98.1 F (36.7 C)  SpO2: 96%    General Awake, no distress. NAD HEENT NCAT. PERRL. EOMI. mild blepharitis noted bilaterally.  Conjunctiva are clear bilaterally.  No rhinorrhea. Mucous membranes are moist.  CV:  Good peripheral perfusion.  RESP:  Normal effort.  ABD:  No distention.  SKIN:  With multiple areas of vesicular eruptions on erythematous base.   ED Results / Procedures / Treatments   Labs (all labs ordered are listed, but only abnormal results are displayed) Labs Reviewed - No data to display   EKG   RADIOLOGY    No results found.   PROCEDURES:  Critical Care performed: No  Procedures   MEDICATIONS ORDERED IN ED: Medications  dexamethasone (DECADRON) injection 10 mg (10 mg  Intramuscular Given 12/23/22 2227)  famotidine (PEPCID) tablet 40 mg (40 mg Oral Given 12/23/22 2227)     IMPRESSION / MDM / ASSESSMENT AND PLAN / ED COURSE  I reviewed the triage vital signs and the nursing notes.                              Differential diagnosis includes, but is not limited to, dermatitis, eczema exacerbation, chickenpox, cellulitis  Patient's presentation is most consistent with acute, uncomplicated illness.  Patient to the ED with symptoms consistent with contact dermatitis secondary to poison ivy.  Patient's diagnosis is consistent with dermatitis. Patient will be discharged home with prescriptions for monitoring, prednisone, and mupirocin ointment. Patient is to follow up with primary provider or local urgent care as needed or otherwise directed. Patient is given ED precautions to return to the ED for any worsening or new symptoms.     FINAL CLINICAL IMPRESSION(S) / ED DIAGNOSES   Final diagnoses:  Poison ivy dermatitis     Rx / DC Orders   ED Discharge  Orders          Ordered    predniSONE (STERAPRED UNI-PAK 48 TAB) 5 MG (48) TBPK tablet        12/23/22 2215    famotidine (PEPCID) 20 MG tablet  2 times daily        12/23/22 2215    mupirocin ointment (BACTROBAN) 2 %        12/23/22 2215             Note:  This document was prepared using Dragon voice recognition software and may include unintentional dictation errors.    Melvenia Needles, PA-C 12/23/22 2300    Harvest Dark, MD 12/23/22 2303

## 2022-12-23 NOTE — ED Triage Notes (Signed)
  Patient comes in with poison ivy on face, R arm, groin, and torso.  Patient states it has been going on for about 2 days.  States this has happened before in the past and required steroid injection.  Took benadryl 50 mg around 1500 today.  No pain, but itchy.

## 2022-12-23 NOTE — Discharge Instructions (Addendum)
Take the prescription steroid as well as the famotidine daily as directed.  Take OTC Benadryl every 4-6 hours needed for itch relief.  Can consider daily nondrowsy allergy medicine like Claritin, Zyrtec, or Allegra.  Use antibiotic ointment to any weeping or festering blisters.  Use OTC hydrocortisone cream or Caladryl lotion for the skin.  Avoid getting overheated, sweating, or taking long hot showers.

## 2024-11-11 ENCOUNTER — Emergency Department
Admission: EM | Admit: 2024-11-11 | Discharge: 2024-11-11 | Disposition: A | Discharge status: Home / Self Care | Payer: Self-pay

## 2024-11-11 ENCOUNTER — Other Ambulatory Visit: Payer: Self-pay

## 2024-11-11 ENCOUNTER — Emergency Department: Payer: Self-pay

## 2024-11-11 ENCOUNTER — Encounter: Payer: Self-pay | Admitting: *Deleted

## 2024-11-11 DIAGNOSIS — R079 Chest pain, unspecified: Secondary | ICD-10-CM

## 2024-11-11 DIAGNOSIS — I10 Essential (primary) hypertension: Secondary | ICD-10-CM | POA: Insufficient documentation

## 2024-11-11 LAB — BASIC METABOLIC PANEL WITH GFR
Anion gap: 15 (ref 5–15)
BUN: 13 mg/dL (ref 6–20)
CO2: 22 mmol/L (ref 22–32)
Calcium: 9.4 mg/dL (ref 8.9–10.3)
Chloride: 102 mmol/L (ref 98–111)
Creatinine, Ser: 0.69 mg/dL (ref 0.61–1.24)
GFR, Estimated: >60 mL/min
Glucose, Bld: 113 mg/dL — ABNORMAL HIGH (ref 70–99)
Potassium: 4 mmol/L (ref 3.5–5.1)
Sodium: 139 mmol/L (ref 135–145)

## 2024-11-11 LAB — CBC
HCT: 45.8 % (ref 39.0–52.0)
Hemoglobin: 16.2 g/dL (ref 13.0–17.0)
MCH: 31.2 pg (ref 26.0–34.0)
MCHC: 35.4 g/dL (ref 30.0–36.0)
MCV: 88.2 fL (ref 80.0–100.0)
Platelets: 183 10*3/uL (ref 150–400)
RBC: 5.19 MIL/uL (ref 4.22–5.81)
RDW: 12.1 % (ref 11.5–15.5)
WBC: 5.6 10*3/uL (ref 4.0–10.5)
nRBC: 0 % (ref 0.0–0.2)

## 2024-11-11 LAB — TROPONIN T, HIGH SENSITIVITY
Troponin T High Sensitivity: 10 ng/L (ref 0–19)
Troponin T High Sensitivity: 9 ng/L (ref 0–19)

## 2024-11-11 MED ORDER — AMLODIPINE BESYLATE 5 MG PO TABS
5.0000 mg | ORAL_TABLET | Freq: Once | ORAL | Status: AC
  Administered 2024-11-11: 5 mg via ORAL
  Filled 2024-11-11: qty 1

## 2024-11-11 MED ORDER — AMLODIPINE BESYLATE 5 MG PO TABS: 5.0000 mg | ORAL_TABLET | Freq: Every day | ORAL | 3 refills | Status: AC

## 2024-11-11 NOTE — ED Provider Notes (Signed)
 "  [facility] Provider Note    Event Date/Time   First MD Initiated Contact with Patient 11/11/24 [phone]     (approximate)   History   Chest Pain   HPI  Ricky Green is a 39 y.o. male with past medical history of syncope with history of abnormal EKG with anterior ST elevation previously followed by [facility] cardiology [facility], last seen 03/15/2021) who underwent cardiac cath on 03/15/2021 with 10% LAD and EF 55 to 60% who presents in jail custody with chest pain.  Patient states the chest pain initiated this morning after waking up approximately 9 hours ago.  Pain is constant and crampy.  He states that he has long discontinued all medication including blood pressure medications and has not followed up with cardiology.  Denies any abdominal pain shortness of breath nausea vomiting changes in urinary or bowel habits.     Physical Exam   Triage Vital Signs: ED Triage Vitals [11/11/24 1514]  Encounter Vitals Group     BP (!) 151/125     Girls Systolic BP Percentile      Girls Diastolic BP Percentile      Boys Systolic BP Percentile      Boys Diastolic BP Percentile      Pulse Rate (!) 101     Resp 20     Temp 98.6 F (37 C)     Temp Source Oral     SpO2 95 %     Weight 154 lb (69.9 kg)     Height 5' 5 (1.651 m)     Head Circumference      Peak Flow      Pain Score 8     Pain Loc      Pain Education      Exclude from Growth Chart     Most recent vital signs: Vitals:   11/11/24 1514 11/11/24 1638  BP: (!) 151/125 (!) 151/125  Pulse: (!) 101   Resp: 20   Temp: 98.6 F (37 C)   SpO2: 95%     Nursing Triage Note reviewed. Vital signs reviewed and patients oxygen saturation is normoxic  General: Patient is well nourished, well developed, awake and alert, resting comfortably in no acute distress Head: Normocephalic and atraumatic Eyes: Normal inspection, extraocular muscles intact, no conjunctival pallor Ear, nose, throat: Normal external exam Neck:  Normal range of motion Respiratory: Patient is in no respiratory distress, lungs CTAB Cardiovascular: Patient is not tachycardic, RRR without murmur appreciated GI: Abd SNT with no guarding or rebound  Back: Normal inspection of the back with good strength and range of motion throughout all ext Extremities: pulses intact with good cap refills, no LE pitting edema or calf tenderness Neuro: The patient is alert and oriented to person, place, and time, appropriately conversive, with 5/5 bilat UE/LE strength, no gross motor or sensory defects noted. Coordination appears to be adequate. Skin: Warm, dry, and intact Psych: normal mood and affect, no SI or HI  ED Results / Procedures / Treatments   Labs (all labs ordered are listed, but only abnormal results are displayed) Labs Reviewed  BASIC METABOLIC PANEL WITH GFR - Abnormal; Notable for the following components:      Result Value   Glucose, Bld 113 (*)    All other components within normal limits  CBC  TROPONIN T, HIGH SENSITIVITY  TROPONIN T, HIGH SENSITIVITY     EKG EKG and rhythm strip are interpreted by myself:   EKG: tachycardic  sinus rhythm] at heart rate of 111, normal QRS duration, QTc 451, nonspecific ST segments and T waves no ectopy EKG not consistent with Acute STEMI Rhythm strip: tachycardic sinus rhythm in lead II   RADIOLOGY CXR: No acute abnormality on my independent review interpretation radiologist agrees    PROCEDURES:  Critical Care performed: No  Procedures   MEDICATIONS ORDERED IN ED: Medications  amLODipine  (NORVASC ) tablet 5 mg (5 mg Oral Given 11/11/24 1638)     IMPRESSION / MDM / ASSESSMENT AND PLAN / ED COURSE                                Differential diagnosis includes, but is not limited to, ACS, electrolyte derangement anemia, pneumonia   ED course: Patient presents and he is well-appearing and satting well on room air.  He is initially tachycardic on presentation but on my  assessment in the room his heart rate does downtrend to 89 without intervention.  I did consider pulmonary embolism however patient adamantly denies any shortness of breath.  His blood pressure is elevated and so I have initiated him on Norvasc .  He had 2 troponins which were not elevated and chest x-ray was unremarkable.  He feels comfortable returning home.  He will follow-up with cardiology and I have placed a referral   Clinical Course as of 11/12/24 0004  Mon Nov 11, 2024  1738 Troponin T High Sensitivity: 9 Not elevated will work on discharge [HD]  1738 I will send him with scripts for amlodipine  [HD]    Clinical Course User Index [HD] Nicholaus Rolland BRAVO, MD   At time of discharge there is no evidence of acute life, limb, vision, or fertility threat. Patient has stable vital signs, pain is well controlled, patient is ambulatory and p.o. tolerant.  Discharge instructions were completed using the EPIC system. I would refer you to those at this time. All warnings prescriptions follow-up etc. were discussed in detail with the patient. Patient indicates understanding and is agreeable with this plan. All questions answered.  Patient is made aware that they may return to the emergency department for any worsening or new condition or for any other emergency.   -- Risk: 5 This patient has a high risk of morbidity due to further diagnostic testing or treatment. Rationale: This patients evaluation and management involve a high risk of morbidity due to the potential severity of presenting symptoms, need for diagnostic testing, and/or initiation of treatment that may require close monitoring. The differential includes conditions with potential for significant deterioration or requiring escalation of care. Treatment decisions in the ED, including medication administration, procedural interventions, or disposition planning, reflect this level of risk. COPA: 5 The patient has the following acute or  chronic illness/injury that poses a possible threat to life or bodily function: [X] : The patient has a potentially serious acute condition or an acute exacerbation of a chronic illness requiring urgent evaluation and management in the Emergency Department. The clinical presentation necessitates immediate consideration of life-threatening or function-threatening diagnoses, even if they are ultimately ruled out.   FINAL CLINICAL IMPRESSION(S) / ED DIAGNOSES   Final diagnoses:  Chest pain, unspecified type  Hypertension, unspecified type     Rx / DC Orders   ED Discharge Orders          Ordered    amLODipine  (NORVASC ) 5 MG tablet  Daily        11/11/24 1741  Ambulatory referral to Cardiology       Comments: If you have not heard from the Cardiology office within the next 72 hours please call [phone].   11/11/24 1741             Note:  This document was prepared using Dragon voice recognition software and may include unintentional dictation errors.   Nicholaus Rolland BRAVO, MD 11/12/24 0005  "
